# Patient Record
Sex: Female | Born: 1985 | Race: White | Hispanic: No | Marital: Single | Smoking: Never smoker
Health system: Southern US, Community
[De-identification: ages and names within clinical notes are randomized; demographics above are authoritative.]

## PROBLEM LIST (undated history)

## (undated) DIAGNOSIS — M419 Scoliosis, unspecified: Secondary | ICD-10-CM

## (undated) DIAGNOSIS — G809 Cerebral palsy, unspecified: Secondary | ICD-10-CM

## (undated) DIAGNOSIS — H547 Unspecified visual loss: Secondary | ICD-10-CM

## (undated) DIAGNOSIS — G7100 Muscular dystrophy, unspecified: Secondary | ICD-10-CM

---
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
## 2018-11-25 ENCOUNTER — Emergency Department (HOSPITAL_COMMUNITY): Payer: Medicaid Other

## 2018-11-25 ENCOUNTER — Inpatient Hospital Stay (HOSPITAL_COMMUNITY)
Admission: EM | Admit: 2018-11-25 | Discharge: 2018-12-01 | DRG: 178 | Disposition: A | Discharge status: Home / Self Care | Payer: Medicaid Other | Attending: Internal Medicine | Admitting: Internal Medicine

## 2018-11-25 ENCOUNTER — Encounter (HOSPITAL_COMMUNITY): Payer: Self-pay

## 2018-11-25 DIAGNOSIS — K5100 Ulcerative (chronic) pancolitis without complications: Secondary | ICD-10-CM | POA: Diagnosis present

## 2018-11-25 DIAGNOSIS — Z881 Allergy status to other antibiotic agents status: Secondary | ICD-10-CM | POA: Diagnosis not present

## 2018-11-25 DIAGNOSIS — A09 Infectious gastroenteritis and colitis, unspecified: Secondary | ICD-10-CM | POA: Diagnosis present

## 2018-11-25 DIAGNOSIS — R197 Diarrhea, unspecified: Secondary | ICD-10-CM

## 2018-11-25 DIAGNOSIS — J9601 Acute respiratory failure with hypoxia: Secondary | ICD-10-CM | POA: Diagnosis not present

## 2018-11-25 DIAGNOSIS — H547 Unspecified visual loss: Secondary | ICD-10-CM | POA: Diagnosis present

## 2018-11-25 DIAGNOSIS — B370 Candidal stomatitis: Secondary | ICD-10-CM | POA: Diagnosis present

## 2018-11-25 DIAGNOSIS — R112 Nausea with vomiting, unspecified: Secondary | ICD-10-CM | POA: Diagnosis present

## 2018-11-25 DIAGNOSIS — M419 Scoliosis, unspecified: Secondary | ICD-10-CM | POA: Diagnosis present

## 2018-11-25 DIAGNOSIS — G7100 Muscular dystrophy, unspecified: Secondary | ICD-10-CM | POA: Diagnosis present

## 2018-11-25 DIAGNOSIS — E876 Hypokalemia: Secondary | ICD-10-CM | POA: Diagnosis not present

## 2018-11-25 DIAGNOSIS — E860 Dehydration: Secondary | ICD-10-CM | POA: Diagnosis present

## 2018-11-25 DIAGNOSIS — E870 Hyperosmolality and hypernatremia: Secondary | ICD-10-CM | POA: Diagnosis present

## 2018-11-25 DIAGNOSIS — R109 Unspecified abdominal pain: Secondary | ICD-10-CM

## 2018-11-25 DIAGNOSIS — K529 Noninfective gastroenteritis and colitis, unspecified: Secondary | ICD-10-CM | POA: Diagnosis present

## 2018-11-25 DIAGNOSIS — U071 COVID-19: Secondary | ICD-10-CM | POA: Diagnosis not present

## 2018-11-25 DIAGNOSIS — G809 Cerebral palsy, unspecified: Secondary | ICD-10-CM | POA: Diagnosis present

## 2018-11-25 HISTORY — DX: Unspecified visual loss: H54.7

## 2018-11-25 HISTORY — DX: Muscular dystrophy, unspecified: G71.00

## 2018-11-25 HISTORY — DX: Cerebral palsy, unspecified: G80.9

## 2018-11-25 HISTORY — DX: Scoliosis, unspecified: M41.9

## 2018-11-25 LAB — COMPREHENSIVE METABOLIC PANEL
ALT: 44 U/L (ref 0–44)
AST: 71 U/L — ABNORMAL HIGH (ref 15–41)
Albumin: 2.8 g/dL — ABNORMAL LOW (ref 3.5–5.0)
Alkaline Phosphatase: 80 U/L (ref 38–126)
Anion gap: 14 (ref 5–15)
BUN: 7 mg/dL (ref 6–20)
CO2: 23 mmol/L (ref 22–32)
Calcium: 8.1 mg/dL — ABNORMAL LOW (ref 8.9–10.3)
Chloride: 100 mmol/L (ref 98–111)
Creatinine, Ser: 0.68 mg/dL (ref 0.44–1.00)
GFR calc Af Amer: >60 mL/min (ref >60)
GFR calc non Af Amer: >60 mL/min (ref >60)
Glucose, Bld: 76 mg/dL (ref 70–99)
Potassium: 3 mmol/L — ABNORMAL LOW (ref 3.5–5.1)
Sodium: 137 mmol/L (ref 135–145)
Total Bilirubin: 0.7 mg/dL (ref 0.3–1.2)
Total Protein: 7 g/dL (ref 6.5–8.1)

## 2018-11-25 LAB — CBC WITH DIFFERENTIAL/PLATELET
Abs Immature Granulocytes: 0.02 10*3/uL (ref 0.00–0.07)
Basophils Absolute: 0 10*3/uL (ref 0.0–0.1)
Basophils Relative: 0 %
Eosinophils Absolute: 0 10*3/uL (ref 0.0–0.5)
Eosinophils Relative: 0 %
HCT: 42.7 % (ref 36.0–46.0)
Hemoglobin: 13.9 g/dL (ref 12.0–15.0)
Immature Granulocytes: 0 %
Lymphocytes Relative: 18 %
Lymphs Abs: 1.1 10*3/uL (ref 0.7–4.0)
MCH: 27.6 pg (ref 26.0–34.0)
MCHC: 32.6 g/dL (ref 30.0–36.0)
MCV: 84.9 fL (ref 80.0–100.0)
Monocytes Absolute: 0.2 10*3/uL (ref 0.1–1.0)
Monocytes Relative: 3 %
Neutro Abs: 4.8 10*3/uL (ref 1.7–7.7)
Neutrophils Relative %: 79 %
Platelets: 174 10*3/uL (ref 150–400)
RBC: 5.03 MIL/uL (ref 3.87–5.11)
RDW: 14.4 % (ref 11.5–15.5)
WBC: 6.1 10*3/uL (ref 4.0–10.5)
nRBC: 0 % (ref 0.0–0.2)

## 2018-11-25 LAB — LIPASE, BLOOD: Lipase: 23 U/L (ref 11–51)

## 2018-11-25 LAB — I-STAT BETA HCG BLOOD, ED (MC, WL, AP ONLY): I-stat hCG, quantitative: <5 m[IU]/mL (ref <5)

## 2018-11-25 LAB — HCG, QUANTITATIVE, PREGNANCY: hCG, Beta Chain, Quant, S: <1 m[IU]/mL (ref <5)

## 2018-11-25 LAB — LACTIC ACID, PLASMA: Lactic Acid, Venous: 1 mmol/L (ref 0.5–1.9)

## 2018-11-25 MED ORDER — ONDANSETRON HCL 4 MG/2ML IJ SOLN
4.0000 mg | Freq: Once | INTRAMUSCULAR | Status: AC
  Administered 2018-11-25: 4 mg via INTRAVENOUS
  Filled 2018-11-25: qty 2

## 2018-11-25 MED ORDER — HYDROCOD POLST-CPM POLST ER 10-8 MG/5ML PO SUER
5.0000 mL | Freq: Two times a day (BID) | ORAL | Status: DC | PRN
  Filled 2018-11-25: qty 5

## 2018-11-25 MED ORDER — ONDANSETRON HCL 4 MG PO TABS: 4.0000 mg | ORAL_TABLET | Freq: Four times a day (QID) | ORAL | Status: DC | PRN

## 2018-11-25 MED ORDER — ONDANSETRON HCL 4 MG/2ML IJ SOLN
4.0000 mg | Freq: Four times a day (QID) | INTRAMUSCULAR | Status: DC | PRN
  Administered 2018-11-26 – 2018-11-30 (×9): 4 mg via INTRAVENOUS
  Filled 2018-11-25 (×9): qty 2

## 2018-11-25 MED ORDER — GUAIFENESIN-DM 100-10 MG/5ML PO SYRP: 10.0000 mL | ORAL_SOLUTION | ORAL | Status: DC | PRN

## 2018-11-25 MED ORDER — ENOXAPARIN SODIUM 40 MG/0.4ML ~~LOC~~ SOLN
40.0000 mg | Freq: Every day | SUBCUTANEOUS | Status: DC
  Administered 2018-11-26 – 2018-12-01 (×6): 40 mg via SUBCUTANEOUS
  Filled 2018-11-25 (×6): qty 0.4

## 2018-11-25 MED ORDER — SODIUM CHLORIDE 0.9 % IV BOLUS
1000.0000 mL | Freq: Once | INTRAVENOUS | Status: AC
  Administered 2018-11-25: 1000 mL via INTRAVENOUS

## 2018-11-25 MED ORDER — POTASSIUM CHLORIDE 10 MEQ/100ML IV SOLN
10.0000 meq | INTRAVENOUS | Status: AC
  Administered 2018-11-26 (×2): 10 meq via INTRAVENOUS
  Filled 2018-11-25 (×2): qty 100

## 2018-11-25 MED ORDER — ACETAMINOPHEN 325 MG PO TABS
650.0000 mg | ORAL_TABLET | Freq: Once | ORAL | Status: AC
  Administered 2018-11-25: 650 mg via ORAL
  Filled 2018-11-25: qty 2

## 2018-11-25 MED ORDER — POTASSIUM CHLORIDE CRYS ER 20 MEQ PO TBCR
40.0000 meq | EXTENDED_RELEASE_TABLET | Freq: Once | ORAL | Status: AC
  Administered 2018-11-25: 40 meq via ORAL
  Filled 2018-11-25: qty 2

## 2018-11-25 MED ORDER — METRONIDAZOLE IN NACL 5-0.79 MG/ML-% IV SOLN
500.0000 mg | Freq: Once | INTRAVENOUS | Status: DC
  Filled 2018-11-25: qty 100

## 2018-11-25 MED ORDER — IOHEXOL 300 MG/ML  SOLN
100.0000 mL | Freq: Once | INTRAMUSCULAR | Status: AC | PRN
  Administered 2018-11-25: 22:00:00 100 mL via INTRAVENOUS

## 2018-11-25 MED ORDER — SODIUM CHLORIDE (PF) 0.9 % IJ SOLN
INTRAMUSCULAR | Status: AC
  Administered 2018-11-25: 22:00:00
  Filled 2018-11-25: qty 50

## 2018-11-25 MED ORDER — CIPROFLOXACIN IN D5W 400 MG/200ML IV SOLN
400.0000 mg | Freq: Once | INTRAVENOUS | Status: DC
  Filled 2018-11-25: qty 200

## 2018-11-25 MED ORDER — SODIUM CHLORIDE 0.9 % IV SOLN
INTRAVENOUS | Status: DC
  Administered 2018-11-26 (×3): via INTRAVENOUS

## 2018-11-25 NOTE — ED Triage Notes (Signed)
Pt has cerebral palsy, c/o vomiting and diarrhea. Pt PCP told mother to bring pt here for hydration.

## 2018-11-25 NOTE — ED Notes (Signed)
Pure wick has been placed. Pt has been educated on pure wick. Suction set to 45mmHg 

## 2018-11-25 NOTE — ED Notes (Signed)
Bed: SH68 Expected date:  Expected time:  Means of arrival:  Comments: covid positive-dehydration

## 2018-11-25 NOTE — ED Notes (Signed)
Pt attempted to use BSC with hat to give urine specimen but had episode of diarrhea that contaminated urine specimen.

## 2018-11-25 NOTE — ED Notes (Signed)
Pt transported to CT ?

## 2018-11-25 NOTE — ED Notes (Signed)
Unsuccessful IV attempt x2.  

## 2018-11-25 NOTE — H&P (Signed)
History and Physical    Tina Oliver:[id number] DOB: 1985-11-23 DOA: 11/25/2018  PCP: Abigail Miyamoto, MD  Patient coming from: Home  I have personally briefly reviewed patient's old medical records in [facility]  Chief Complaint: N/V/D  HPI: Tina Oliver is a 33 y.o. female with medical history significant of CP, MD, total blindness.  Patient is cared for at home by parents.  Father tested positive for COVID x11 days ago and has been ill at home.  Patient has also tested positive for COVID-19 as an outpatient per mother.  Patient has had fever, but little to no respiratory symptoms.  Patient has developed intractable N/V/D at home, zofran, phenergan, and tylenol have been tried without success.  Patient not able to tolerate POs.  Told by PCP to bring to hospital for IVF.   ED Course: WBC 6.x, CT abd/pelvis demonstrates colitis and BLL diffuse infiltrates.   Review of Systems: Unable to perform due to CP  Past Medical History:  Diagnosis Date   Cerebral palsy (HCC)    Muscular dystrophy (HCC)    Scoliosis    Total blindness    both eyes    History reviewed. No pertinent surgical history.   reports that she has never smoked. She has never used smokeless tobacco. She reports that she does not drink alcohol or use drugs.  Allergies  Allergen Reactions   Ceclor [Cefaclor] Nausea And Vomiting   Amoxicillin Rash    Mild rash as a young child. Unknown cephalosporin history/not tried    History reviewed. No pertinent family history. Father also has confirmed COVID-19  Prior to Admission medications   Medication Sig Start Date End Date Taking? Authorizing Provider  acetaminophen (TYLENOL) 500 MG tablet Take 1,000 mg by mouth every 6 (six) hours as needed for moderate pain.   Yes [provider]  dicyclomine (BENTYL) 10 MG/5ML syrup Take 10 mg by mouth 3 (three) times daily before meals.  11/23/18  Yes [provider]    norelgestromin-ethinyl estradiol [facility]) 150-35 MCG/24HR transdermal patch Place 1 patch onto the skin See admin instructions. Change every 10 days   Yes [provider]    Physical Exam: Vitals:   11/25/18 1917 11/25/18 2000 11/25/18 2014 11/25/18 2222  BP: (!) 101/45 102/63  91/66  Pulse: 82 82  91  Resp: (!) Temp:   (!) 101.6 F (38.7 C)   TempSrc:   Oral   SpO2: 94% 95%  93%  Weight:      Height:        Constitutional: NAD, calm, comfortable Eyes: PERRL, lids and conjunctivae normal ENMT: Mucous membranes are moist. Posterior pharynx clear of any exudate or lesions.Normal dentition.  Neck: normal, supple, no masses, no thyromegaly Respiratory: clear to auscultation bilaterally, no wheezing, no crackles. Normal respiratory effort. No accessory muscle use.  Cardiovascular: Regular rate and rhythm, no murmurs / rubs / gallops. No extremity edema. 2+ pedal pulses. No carotid bruits.  Abdomen: no tenderness, no masses palpated. No hepatosplenomegaly. Bowel sounds positive.  Musculoskeletal: no clubbing / cyanosis. No joint deformity upper and lower extremities. Good ROM, no contractures. Normal muscle tone.  Skin: no rashes, lesions, ulcers. No induration Neurologic: CN 2-12 grossly intact. Sensation intact, DTR normal. Strength 5/5 in all 4.  Psychiatric: Normal judgment and insight. Alert and oriented x 3. Normal mood.    Labs on Admission: I have personally reviewed following labs and imaging studies  CBC: Recent  Labs  Lab 11/25/18 1753  WBC 6.1  NEUTROABS 4.8  HGB 13.9  HCT 42.7  MCV 84.9  PLT 174   Basic Metabolic Panel: Recent Labs  Lab 11/25/18 1753  NA 137  K 3.0*  CL 100  CO2 23  GLUCOSE 76  BUN 7  CREATININE 0.68  CALCIUM 8.1*   GFR: Estimated Creatinine Clearance: 96.5 mL/min (by C-G formula based on SCr of 0.68 mg/dL). Liver Function Tests: Recent Labs  Lab 11/25/18 1753  AST 71*  ALT 44  ALKPHOS 80  BILITOT 0.7  PROT  7.0  ALBUMIN 2.8*   Recent Labs  Lab 11/25/18 1753  LIPASE 23   No results for input(s): AMMONIA in the last 168 hours. Coagulation Profile: No results for input(s): INR, PROTIME in the last 168 hours. Cardiac Enzymes: No results for input(s): CKTOTAL, CKMB, CKMBINDEX, TROPONINI in the last 168 hours. BNP (last 3 results) No results for input(s): PROBNP in the last 8760 hours. HbA1C: No results for input(s): HGBA1C in the last 72 hours. CBG: No results for input(s): GLUCAP in the last 168 hours. Lipid Profile: No results for input(s): CHOL, HDL, LDLCALC, TRIG, CHOLHDL, LDLDIRECT in the last 72 hours. Thyroid Function Tests: No results for input(s): TSH, T4TOTAL, FREET4, T3FREE, THYROIDAB in the last 72 hours. Anemia Panel: No results for input(s): VITAMINB12, FOLATE, FERRITIN, TIBC, IRON, RETICCTPCT in the last 72 hours. Urine analysis: No results found for: COLORURINE, APPEARANCEUR, LABSPEC, PHURINE, GLUCOSEU, HGBUR, BILIRUBINUR, KETONESUR, PROTEINUR, UROBILINOGEN, NITRITE, LEUKOCYTESUR  Radiological Exams on Admission: Ct Abdomen Pelvis W Contrast  Result Date: 11/25/2018 CLINICAL DATA:  Abdominal pain EXAM: CT ABDOMEN AND PELVIS WITH CONTRAST TECHNIQUE: Multidetector CT imaging of the abdomen and pelvis was performed using the standard protocol following bolus administration of intravenous contrast. CONTRAST:  100mL OMNIPAQUE IOHEXOL 300 MG/ML  SOLN COMPARISON:  None. FINDINGS: Lower chest: Extensive, irregular ground-glass and heterogeneous opacity of the included bilateral lung bases. Hepatobiliary: No focal liver abnormality is seen. No gallstones, gallbladder wall thickening, or biliary dilatation. Pancreas: Unremarkable. No pancreatic ductal dilatation or surrounding inflammatory changes. Spleen: Normal in size without focal abnormality. Adrenals/Urinary Tract: Adrenal glands are unremarkable. Kidneys are normal, without renal calculi, focal lesion, or hydronephrosis. Bladder  is unremarkable. Stomach/Bowel: Stomach is within normal limits. Appendix appears normal. Mild hyperenhancement of the distal descending colon, sigmoid, and rectum, with a somewhat featureless appearance (series 2, image 62, series 7, image 108). Vascular/Lymphatic: No significant vascular findings are present. No enlarged abdominal or pelvic lymph nodes. Reproductive: No mass or other abnormality. Other: No abdominal wall hernia or abnormality. No abdominopelvic ascites. Musculoskeletal: No acute or significant osseous findings. IMPRESSION: 1. Mild hyperenhancement of the distal descending colon, sigmoid, and rectum, with a somewhat featureless appearance (series 2, image 62, series 7, image 108). This appearance is consistent with nonspecific infectious or inflammatory proctocolitis, and particularly inflammatory bowel disease such as ulcerative colitis is a consideration with this appearance. 2. Extensive, irregular ground-glass and heterogeneous opacity of the included bilateral lung bases, consistent with infection or aspiration. Electronically Signed   By: Lauralyn PrimesAlex  Bibbey M.D.   On: 11/25/2018 22:01   Dg Chest Port 1 View  Result Date: 11/25/2018 CLINICAL DATA:  Vomiting, diarrhea, history of cerebral palsy EXAM: PORTABLE CHEST 1 VIEW COMPARISON:  None. FINDINGS: Cardiomegaly. Left basilar heterogeneous opacity and probable small layering pleural effusion. IMPRESSION: Cardiomegaly. Left basilar heterogeneous opacity and probable small layering pleural effusion, concerning for infection or aspiration. Electronically Signed   By: Trinna PostAlex  Jayme Cloud M.D.   On: 11/25/2018 19:39    EKG: Independently reviewed.  Assessment/Plan Principal Problem:   COVID-19 virus infection Active Problems:   Nausea vomiting and diarrhea   Colitis presumed infectious    1. COVID-19 - confirmed on outpatient testing per mother's report, CT findings also c/w COVID, dont really have a reason to re-test at this point. 1. COVID  pathway 2. IVF 3. Replace K 4. CRP, D.Dimer, procalcitonin, pending 5. Repeat CBC/CMP in AM 2. N/V/D, colitis - 1. Very likely secondary to COVID 2. No WBC 3. Checking pro calcitonin 4. IVF: NS at 125 cc/hr 5. zofran PRN  DVT prophylaxis: Lovenox Code Status: Full Family Communication: Mother at bedside, is apparently going to be staying with patient.  Very high risk for mother to come down with COVID as she has been caring for patient, and husband at home, who both have COVID, and mother isnt even wearing a mask (did discuss this with mother and have STRONGLY urged her to use all recommended PPE at all times at a minimum). Disposition Plan: Home after admit Consults called: None Admission status: Admit to inpatient  Severity of Illness: The appropriate patient status for this patient is INPATIENT. Inpatient status is judged to be reasonable and necessary in order to provide the required intensity of service to ensure the patient's safety. The patient's presenting symptoms, physical exam findings, and initial radiographic and laboratory data in the context of their chronic comorbidities is felt to place them at high risk for further clinical deterioration. Furthermore, it is not anticipated that the patient will be medically stable for discharge from the hospital within 2 midnights of admission. The following factors support the patient status of inpatient.   " The patient's presenting symptoms include N/V/D, known to be COVID-19 positive. " The worrisome physical exam findings include Hypoxia with new O2 requirement. " The initial radiographic and laboratory data are worrisome because of B infiltrates on CT scan, colitis on CT. " The chronic co-morbidities include blindness, MD, CP.   * I certify that at the point of admission it is my clinical judgment that the patient will require inpatient hospital care spanning beyond 2 midnights from the point of admission due to high intensity of  service, high risk for further deterioration and high frequency of surveillance required.*    Kaemon Barnett M. DO [facility]  How to contact the [facility] Attending or Consulting provider 7A - 7P or covering provider during after hours 7P -7A, for this patient?  1. Check the care team in [facility] and look for a) attending/consulting [facility] provider listed and b) the [facility] team listed 2. Log into [email or web address]  Amion Physician Scheduling and messaging for groups and whole hospitals  On call and physician scheduling software for group practices, residents, hospitalists and other medical providers for call, clinic, rotation and shift schedules. On[employer] is a hospital-wide system for scheduling doctors and paging doctors on call. EasyPlot is for scientific plotting and data analysis.  [email or web address]  and use [facility] universal password to access. If you do not have the password, please contact the hospital operator.  3. Locate the [facility] provider you are looking for under [facility] and page to a number that you can be directly reached. 4. If you still have difficulty reaching the provider, please page the [facility] (Director on Call) for the Hospitalists listed on amion for assistance.  11/25/2018, 11:41 PM

## 2018-11-25 NOTE — ED Notes (Addendum)
Pt's mom at bedside without proper PPE. Writer explained to mom the proper PPE that should be worn and mom verified she understood what needed to be worn. Pt's mom has on gown and mask at this time.

## 2018-11-25 NOTE — ED Provider Notes (Signed)
[facility]-EMERGENCY DEPT Provider Note   CSN: [id number] Arrival date & time: 11/25/18  1514    History   Chief Complaint Chief Complaint  Patient presents with  . Dehydration    COVID-19 +    HPI Tina Oliver is a 33 y.o. female.     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 Patient and her father tested positive for COVID 11 days ago, patient has had fever, little/no respiratory symptoms. Patient has tried Zofran, Phenergan PR, Tylenol PR at home with little relief/unable to tolerate due to frequent diarrhea (loose, non bloody stools). No prior abdominal surgeries. No concerns for cough, respiratory complications.      Past Medical History:  Diagnosis Date  . Cerebral palsy (HCC)   . Muscular dystrophy (HCC)   . Scoliosis   . Total blindness    both eyes    There are no active problems to display for this patient.   OB History   No obstetric history on file.      Home Medications    Prior to Admission medications   Not on File    Family History No family history on file.  Social History Social History   Tobacco Use  . Smoking status: Never Smoker  . Smokeless tobacco: Never Used  Substance Use Topics  . Alcohol use: Never    Frequency: Never  . Drug use: Never     Allergies   Ceclor [cefaclor] and Amoxicillin   Review of Systems Review of Systems  Unable to perform ROS: Other (chronic health conditions )  Constitutional: Positive for fever.  Respiratory: Negative for cough and shortness of breath.   Gastrointestinal: Positive for abdominal pain, diarrhea and vomiting. Negative for abdominal distention and blood in stool.  Skin: Negative for rash and wound.     Physical Exam Updated Vital Signs BP 91/66   Pulse 91   Temp (!) 101.6 F (38.7 C) (Oral)   Resp 19   Ht  (1.626 m)   Wt 68 kg   SpO2 93%   BMI  25.75 kg/m   Physical Exam Vitals signs and nursing note reviewed.  Constitutional:      Comments: thin  HENT:     Head: Normocephalic and atraumatic.     Mouth/Throat:     Mouth: Mucous membranes are dry.  Cardiovascular:     Rate and Rhythm: Normal rate and regular rhythm.     Pulses: Normal pulses.  Pulmonary:     Effort: Pulmonary effort is normal.  Abdominal:     Tenderness: There is abdominal tenderness in the left lower quadrant. There is guarding.  Musculoskeletal:        General: No tenderness, deformity or signs of injury.  Skin:    General: Skin is warm and dry.     Findings: No erythema or rash.  Neurological:     General: No focal deficit present.     Mental Status: She is alert. Mental status is at baseline.      ED Treatments / Results  Labs (all labs ordered are listed, but only abnormal results are displayed) Labs Reviewed  COMPREHENSIVE METABOLIC PANEL - Abnormal; Notable for the following components:      Result Value   Potassium 3.0 (*)    Calcium 8.1 (*)    Albumin 2.8 (*)    AST 71 (*)    All other  components within normal limits  CULTURE, BLOOD (ROUTINE X 2)  CULTURE, BLOOD (ROUTINE X 2)  CBC WITH DIFFERENTIAL/PLATELET  LIPASE, BLOOD  LACTIC ACID, PLASMA  HCG, QUANTITATIVE, PREGNANCY  URINALYSIS, ROUTINE W REFLEX MICROSCOPIC  LACTIC ACID, PLASMA  I-STAT BETA HCG BLOOD, ED ([facility], [facility], [facility] ONLY)    EKG None  Radiology Ct Abdomen Pelvis W Contrast  Result Date: 11/25/2018 CLINICAL DATA:  Abdominal pain EXAM: CT ABDOMEN AND PELVIS WITH CONTRAST TECHNIQUE: Multidetector CT imaging of the abdomen and pelvis was performed using the standard protocol following bolus administration of intravenous contrast. CONTRAST:  OMNIPAQUE IOHEXOL 300 MG/ML  SOLN COMPARISON:  None. FINDINGS: Lower chest: Extensive, irregular ground-glass and heterogeneous opacity of the included bilateral lung bases. Hepatobiliary: No focal liver abnormality is seen. No  gallstones, gallbladder wall thickening, or biliary dilatation. Pancreas: Unremarkable. No pancreatic ductal dilatation or surrounding inflammatory changes. Spleen: Normal in size without focal abnormality. Adrenals/Urinary Tract: Adrenal glands are unremarkable. Kidneys are normal, without renal calculi, focal lesion, or hydronephrosis. Bladder is unremarkable. Stomach/Bowel: Stomach is within normal limits. Appendix appears normal. Mild hyperenhancement of the distal descending colon, sigmoid, and rectum, with a somewhat featureless appearance (series 2, image 62, series 7, image 108). Vascular/Lymphatic: No significant vascular findings are present. No enlarged abdominal or pelvic lymph nodes. Reproductive: No mass or other abnormality. Other: No abdominal wall hernia or abnormality. No abdominopelvic ascites. Musculoskeletal: No acute or significant osseous findings. IMPRESSION: 1. Mild hyperenhancement of the distal descending colon, sigmoid, and rectum, with a somewhat featureless appearance (series 2, image 62, series 7, image 108). This appearance is consistent with nonspecific infectious or inflammatory proctocolitis, and particularly inflammatory bowel disease such as ulcerative colitis is a consideration with this appearance. 2. Extensive, irregular ground-glass and heterogeneous opacity of the included bilateral lung bases, consistent with infection or aspiration. Electronically Signed   By: Lauralyn Primes M.D.   On: 11/25/2018 22:01   Dg Chest Port 1 View  Result Date: 11/25/2018 CLINICAL DATA:  Vomiting, diarrhea, history of cerebral palsy EXAM: PORTABLE CHEST 1 VIEW COMPARISON:  None. FINDINGS: Cardiomegaly. Left basilar heterogeneous opacity and probable small layering pleural effusion. IMPRESSION: Cardiomegaly. Left basilar heterogeneous opacity and probable small layering pleural effusion, concerning for infection or aspiration. Electronically Signed   By: Lauralyn Primes M.D.   On: 11/25/2018 19:39     Procedures Procedures (including critical care time)  Medications Ordered in ED Medications  ciprofloxacin (CIPRO) IVPB 400 mg (has no administration in time range)  metroNIDAZOLE (FLAGYL) IVPB 500 mg (has no administration in time range)  ondansetron (ZOFRAN) injection 4 mg (4 mg Intravenous Given 11/25/18 1800)  sodium chloride 0.9 % bolus 1,000 mL (0 mLs Intravenous Stopped 11/25/18 1915)  sodium chloride 0.9 % bolus 1,000 mL (1,000 mLs Intravenous New Bag/Given 11/25/18 2215)  sodium chloride (PF) 0.9 % injection (  Given 11/25/18 2223)  iohexol (OMNIPAQUE) 300 MG/ML solution 100 mL (100 mLs Intravenous Contrast Given 11/25/18 2143)  acetaminophen (TYLENOL) tablet 650 mg (650 mg Oral Given 11/25/18 2214)  potassium chloride SA (K-DUR) CR tablet 40 mEq (40 mEq Oral Given 11/25/18 2214)     Initial Impression / Assessment and Plan / ED Course  I have reviewed the triage vital signs and the nursing notes.  Pertinent labs & imaging results that were available during my care of the patient were reviewed by me and considered in my medical decision making (see chart for details).  Clinical Course as of Nov 24 2228  Fri Nov 25, 2018  [id number] 33 year old female with history of muscular dystrophy cerebral palsy, recently diagnosed with COVID 11 days ago.  Mom reports now with vomiting and diarrhea, unable to tolerate p.o.'s at home, no relief with Phenergan as needed Zofran ODT.  On exam patient has dry mucous membranes and left side abdominal tenderness.  Review of lab work, patient's hCG is negative, lipase within normal limits, CMP with mild hypokalemia with potassium of 3.0 which was repleted orally.  Patient's lactic acid is within normal limits, CBC within normal limits.  CT abdomen and pelvis with contrast shows proctocolitis versus ulcerative colitis.  Recommend admission to the hospital for IV antibiotics and monitoring.  Patient's mother would prefer to p.o. challenge in the ER and defers  admission decision at this time.   [LM]    Clinical Course User Index [LM] Jeannie FendMurphy, Marnita Poirier A, PA-C      Final Clinical Impressions(s) / ED Diagnoses   Final diagnoses:  Proctocolitis  COVID-19  Hypokalemia    ED Discharge Orders    None       Alden HippMurphy, Promyse Ardito A, PA-C 11/25/18 2230    Wynetta FinesMessick, Peter C, MD 11/28/18 [phone]

## 2018-11-25 NOTE — ED Notes (Signed)
Carollee Herter, RN aware of previously charted vital signs.

## 2018-11-26 ENCOUNTER — Other Ambulatory Visit: Payer: Self-pay

## 2018-11-26 DIAGNOSIS — U071 COVID-19: Principal | ICD-10-CM

## 2018-11-26 LAB — CBC WITH DIFFERENTIAL/PLATELET
Abs Immature Granulocytes: 0.05 10*3/uL (ref 0.00–0.07)
Basophils Absolute: 0 10*3/uL (ref 0.0–0.1)
Basophils Relative: 0 %
Eosinophils Absolute: 0 10*3/uL (ref 0.0–0.5)
Eosinophils Relative: 0 %
HCT: 45 % (ref 36.0–46.0)
Hemoglobin: 13.9 g/dL (ref 12.0–15.0)
Immature Granulocytes: 1 %
Lymphocytes Relative: 23 %
Lymphs Abs: 1.7 10*3/uL (ref 0.7–4.0)
MCH: 26.7 pg (ref 26.0–34.0)
MCHC: 30.9 g/dL (ref 30.0–36.0)
MCV: 86.4 fL (ref 80.0–100.0)
Monocytes Absolute: 0.2 10*3/uL (ref 0.1–1.0)
Monocytes Relative: 3 %
Neutro Abs: 5.2 10*3/uL (ref 1.7–7.7)
Neutrophils Relative %: 73 %
Platelets: 180 10*3/uL (ref 150–400)
RBC: 5.21 MIL/uL — ABNORMAL HIGH (ref 3.87–5.11)
RDW: 14.7 % (ref 11.5–15.5)
WBC: 7.1 10*3/uL (ref 4.0–10.5)
nRBC: 0 % (ref 0.0–0.2)

## 2018-11-26 LAB — COMPREHENSIVE METABOLIC PANEL
ALT: 39 U/L (ref 0–44)
AST: 60 U/L — ABNORMAL HIGH (ref 15–41)
Albumin: 2.8 g/dL — ABNORMAL LOW (ref 3.5–5.0)
Alkaline Phosphatase: 70 U/L (ref 38–126)
Anion gap: 10 (ref 5–15)
BUN: <5 mg/dL — ABNORMAL LOW (ref 6–20)
CO2: 20 mmol/L — ABNORMAL LOW (ref 22–32)
Calcium: 7.9 mg/dL — ABNORMAL LOW (ref 8.9–10.3)
Chloride: 111 mmol/L (ref 98–111)
Creatinine, Ser: 0.6 mg/dL (ref 0.44–1.00)
GFR calc Af Amer: >60 mL/min (ref >60)
GFR calc non Af Amer: >60 mL/min (ref >60)
Glucose, Bld: 97 mg/dL (ref 70–99)
Potassium: 4 mmol/L (ref 3.5–5.1)
Sodium: 141 mmol/L (ref 135–145)
Total Bilirubin: 0.3 mg/dL (ref 0.3–1.2)
Total Protein: 6.9 g/dL (ref 6.5–8.1)

## 2018-11-26 LAB — D-DIMER, QUANTITATIVE
D-Dimer, Quant: 1.06 ug/mL-FEU — ABNORMAL HIGH (ref 0.00–0.50)
D-Dimer, Quant: 1.45 ug/mL-FEU — ABNORMAL HIGH (ref 0.00–0.50)

## 2018-11-26 LAB — URINALYSIS, ROUTINE W REFLEX MICROSCOPIC
Bilirubin Urine: NEGATIVE
Glucose, UA: NEGATIVE mg/dL
Hgb urine dipstick: NEGATIVE
Ketones, ur: 20 mg/dL — AB
Leukocytes,Ua: NEGATIVE
Nitrite: NEGATIVE
Protein, ur: NEGATIVE mg/dL
Specific Gravity, Urine: 1.036 — ABNORMAL HIGH (ref 1.005–1.030)
pH: 6 (ref 5.0–8.0)

## 2018-11-26 LAB — ABO/RH: ABO/RH(D): O POS

## 2018-11-26 LAB — SARS CORONAVIRUS 2 BY RT PCR (HOSPITAL ORDER, PERFORMED IN ~~LOC~~ HOSPITAL LAB): SARS Coronavirus 2: POSITIVE — AB

## 2018-11-26 LAB — HIV ANTIBODY (ROUTINE TESTING W REFLEX): HIV Screen 4th Generation wRfx: NONREACTIVE

## 2018-11-26 LAB — PROCALCITONIN: Procalcitonin: 94.44 ng/mL

## 2018-11-26 LAB — C-REACTIVE PROTEIN
CRP: 19.9 mg/dL — ABNORMAL HIGH (ref <1.0)
CRP: 25.6 mg/dL — ABNORMAL HIGH (ref <1.0)

## 2018-11-26 LAB — LACTIC ACID, PLASMA: Lactic Acid, Venous: 1.9 mmol/L (ref 0.5–1.9)

## 2018-11-26 MED ORDER — ACETAMINOPHEN 325 MG PO TABS
650.0000 mg | ORAL_TABLET | Freq: Four times a day (QID) | ORAL | Status: DC | PRN
  Administered 2018-11-26: 650 mg via ORAL
  Filled 2018-11-26 (×2): qty 2

## 2018-11-26 MED ORDER — SODIUM CHLORIDE 0.9 % IV SOLN
1.0000 g | Freq: Three times a day (TID) | INTRAVENOUS | Status: DC
  Administered 2018-11-26 – 2018-11-28 (×6): 1 g via INTRAVENOUS
  Filled 2018-11-26 (×7): qty 1

## 2018-11-26 MED ORDER — SODIUM CHLORIDE 0.9 % IV SOLN
1.0000 g | Freq: Three times a day (TID) | INTRAVENOUS | Status: DC
  Administered 2018-11-26: 1 g via INTRAVENOUS
  Filled 2018-11-26 (×3): qty 1

## 2018-11-26 MED ORDER — ORAL CARE MOUTH RINSE
15.0000 mL | Freq: Two times a day (BID) | OROMUCOSAL | Status: DC
  Administered 2018-11-27 – 2018-12-01 (×8): 15 mL via OROMUCOSAL

## 2018-11-26 MED ORDER — MORPHINE SULFATE (PF) 2 MG/ML IV SOLN
2.0000 mg | INTRAVENOUS | Status: DC | PRN
  Administered 2018-11-26 – 2018-11-29 (×9): 2 mg via INTRAVENOUS
  Filled 2018-11-26 (×10): qty 1

## 2018-11-26 MED ORDER — ACETAMINOPHEN 650 MG RE SUPP
650.0000 mg | RECTAL | Status: DC | PRN
  Administered 2018-11-26 – 2018-11-27 (×2): 650 mg via RECTAL
  Filled 2018-11-26 (×2): qty 1

## 2018-11-26 MED ORDER — SODIUM CHLORIDE 0.9 % IV SOLN
INTRAVENOUS | Status: DC
  Administered 2018-11-26 – 2018-11-27 (×2): via INTRAVENOUS

## 2018-11-26 NOTE — Progress Notes (Signed)
CRITICAL VALUE ALERT  Critical Value:  Positive covid Date & Time Notied: 11/26/2018 0346   Provider Notified: [email or web address]   Orders Received/Actions taken: awaiting new orders will continue to monitor patient closely

## 2018-11-26 NOTE — Progress Notes (Signed)
Report called to [place] at [facility].  Notified by primary MD that transfer needed to be held until GI consulted regarding diarrhea.  [facility] notified of same.

## 2018-11-26 NOTE — Progress Notes (Signed)
Patient ID: Tina Oliver, female   DOB: 02-12-1986, 33 y.o.   MRN: [id number]  Called by Dr. Jacqulyn Bath regarding patient's diarrhea and colitis noted on CT. Chart reviewed and discussed with hospitalist. Patient had acute onset of nausea, vomiting, and diarrhea in the setting of having COVID-19. Patient is unable to give a history due to her cerebral palsy but reportedly the diarrhea has been present for a few days without report of rectal bleeding. CT scan shows mild inflammation of the distal colon that is nonspecific. Patient not examined due to positive COVID-19 status and lack of need in my opinion to have any endoscopic procedures done. Discussed my assessment/recs with Dr. Jacqulyn Bath and in my opinion I think her colitis is due to her COVID-19 and NOT due to inflammatory bowel disease. Acute presentation of diarrhea is infection until proven otherwise. Patient pending transfer to [facility] for her COVID. Check GI pathogen panel. Would manage COVID-19 per primary team and if diarrhea persists after resolution of the COVID-19 disease then can evaluate for other etiologies as an outpatient. I do NOT think she needs any endoscopic procedures for this process at this time, since I think it is infectious in etiology related to most likely her COVID-19 but other infectious colitis processes also possible hence the need to check a GI pathogen panel. I do not think Mesalamine or Sulfasalazine enemas are necessary since I do not think she has ulcerative colitis.

## 2018-11-26 NOTE — Progress Notes (Signed)
Pharmacy Antibiotic Note  Tina Oliver is a 33 y.o. female admitted on 11/25/2018 with COVID and now CT abdomen concerning for intra-abdominal infection.  Pharmacy has been consulted for Meropenem dosing given allergy to amoxicillin. WBC wnl, Tm 102.10F. SCr wnl   Plan: -Start meropenem 1 gm IV Q 8 hours  -Monitor CBC, renal fx, cultures and clinical progress  Height: 5\' 3"  (160 cm) Weight: 150 lb 2.1 oz (68.1 kg) IBW/kg (Calculated) : 52.4  Temp (24hrs), Avg:100.7 F (38.2 C), Min:97.9 F (36.6 C), Max:102.9 F (39.4 C)  Recent Labs  Lab 11/25/18 1753 11/26/18 0624  WBC 6.1 7.1  CREATININE 0.68 0.60  LATICACIDVEN 1.0 1.9    Estimated Creatinine Clearance: 94.4 mL/min (by C-G formula based on SCr of 0.6 mg/dL).    Allergies  Allergen Reactions  . Ceclor [Cefaclor] Nausea And Vomiting  . Amoxicillin Rash    Mild rash as a young child. Unknown cephalosporin history/not tried    Antimicrobials this admission: Meropenem 5/16 >>    Dose adjustments this admission:   Microbiology results: 5/16 BCx:    Thank you for allowing pharmacy to be a part of this patient's care.  Vinnie Level, PharmD., BCPS Clinical Pharmacist Clinical phone for [phone] until 7pm: [phone]

## 2018-11-26 NOTE — ED Notes (Signed)
Pt attempted another urine specimen vita the BSC but contaminated it with stool

## 2018-11-26 NOTE — Progress Notes (Signed)
Upon assessment of patient upon arrival, patient oriented to self and dob, but unable to state age, when asked if she knew where she was she stated no, I oriented her to being in the hospital; she stated she was here because her "tummy hurts" when asked why she was brought to the hospital. She speaks in a childlike manor, when answering and seems to have difficulty finding words to express her self at times. Pt is blind and very anxious, when educating on call light and what to do to get someone to come into the room when she needs something, she became tearful and was shaking her legs and asked who was going to be staying with her, I again attempted to orient her to our unit and call light system. When trying to leave the room, she was crying and asking not to be left alone because patient stated "I cant see and Im never alone home" I again tried to reassure her and attempted to use comparisons to home [id number] in her own room at home and her parents in another room and she told me she sleeps in her parents room. Upon further investigation patient is never left unattended and relies on her parents for all ADLS. Because of the visitor limitations her mother isn't at bedside, this patient may benefit for a sitter or family at bedside at some point if sitter isn't a possibility. Notified MD Dr Thedore Mins, on current assessments and needs, because of the special circumstances patients has needs that need to be accommodated if he could assess her and advise if a sitter could be ordered for her in these circumstances, 1:1 initiated by floor staff until further assessments can be made by MD. Charge nurse and [facility] also notified of patients needs and are also working to facilitate care to accomodate patients needs.

## 2018-11-26 NOTE — Progress Notes (Addendum)
PROGRESS NOTE    Tina Oliver  ZOX:[id number] DOB: Jan 20, 1986 DOA: 11/25/2018 PCP: Abigail MiyamotoPerry, Lawrence Edward, MD   Brief Narrative:  Tina Oliver is a 33 y.o. female with medical history significant of CP, MD, total blindness.  Patient is cared for at home by parents.  Father tested positive for COVID x11 days ago and has been ill at home. Patient has also tested positive for COVID-19 as an outpatient per mother. Patient has had fever, but little to no respiratory symptoms.  Patient has developed intractable N/V/D at home, zofran, phenergan, and tylenol have been tried without success.  Patient not able to tolerate POs.  Told by PCP to bring to hospital for IVF. She was admitted under hospitalist service for possible colitis.  Procalcitonin elevated.  She was tested positive again here with COVID-19.  ED Course: WBC 6.x, CT abd/pelvis demonstrates colitis and BLL diffuse infiltrates.  Consultants:   None  Procedures:   None  Antimicrobials:   Zosyn   Subjective: Patient seen and examined.  Mom in the room at the bedside with no mask on.  No mask on the patient either.  Patient stated that she feels horrible due to having abdominal pain.  She denied any shortness of breath.  She has been having persistent fever.  Objective: Vitals:   11/26/18 0540 11/26/18 0608 11/26/18 0759 11/26/18 1000  BP:  117/71    Pulse:  78    Resp:  (!) 24    Temp: (!) 101.1 F (38.4 C)  (!) 102.9 F (39.4 C) (!) 102 F (38.9 C)  TempSrc: Oral  Oral Oral  SpO2:  100% 93%   Weight:      Height:        Intake/Output Summary (Last 24 hours) at 11/26/2018 1015 Last data filed at 11/26/2018 0430 Gross per 24 hour  Intake 2200 ml  Output --  Net 2200 ml   Filed Weights   11/25/18 1611 11/26/18 0230  Weight: 68 kg 68.1 kg    Examination:  General exam: Appears calm and comfortable with poor dentition Respiratory system: Scattered rhonchi bilaterally.  Respiratory effort  normal. Cardiovascular system: S1 & S2 heard, RRR. No JVD, murmurs, rubs, gallops or clicks. No pedal edema. Gastrointestinal system: Abdomen is nondistended, soft and slightly tender at left lower quadrant, no organomegaly or masses felt. Normal bowel sounds heard. Central nervous system: Alert and oriented. No focal neurological deficits. Extremities: Symmetric 5 x 5 power. Skin: No rashes, lesions or ulcers Psychiatry: Judgement and insight appear normal. Mood & affect appropriate.    Data Reviewed: I have personally reviewed following labs and imaging studies  CBC: Recent Labs  Lab 11/25/18 1753 11/26/18 0624  WBC 6.1 7.1  NEUTROABS 4.8 5.2  HGB 13.9 13.9  HCT 42.7 45.0  MCV 84.9 86.4  PLT 174 180   Basic Metabolic Panel: Recent Labs  Lab 11/25/18 1753 11/26/18 0624  NA 137 141  K 3.0* 4.0  CL 100 111  CO2 23 20*  GLUCOSE 76 97  BUN 7 <5*  CREATININE 0.68 0.60  CALCIUM 8.1* 7.9*   GFR: Estimated Creatinine Clearance: 94.4 mL/min (by C-G formula based on SCr of 0.6 mg/dL). Liver Function Tests: Recent Labs  Lab 11/25/18 1753 11/26/18 0624  AST 71* 60*  ALT 44 39  ALKPHOS 80 70  BILITOT 0.7 0.3  PROT 7.0 6.9  ALBUMIN 2.8* 2.8*   Recent Labs  Lab 11/25/18 1753  LIPASE 23   No results for  input(s): AMMONIA in the last 168 hours. Coagulation Profile: No results for input(s): INR, PROTIME in the last 168 hours. Cardiac Enzymes: No results for input(s): CKTOTAL, CKMB, CKMBINDEX, TROPONINI in the last 168 hours. BNP (last 3 results) No results for input(s): PROBNP in the last 8760 hours. HbA1C: No results for input(s): HGBA1C in the last 72 hours. CBG: No results for input(s): GLUCAP in the last 168 hours. Lipid Profile: No results for input(s): CHOL, HDL, LDLCALC, TRIG, CHOLHDL, LDLDIRECT in the last 72 hours. Thyroid Function Tests: No results for input(s): TSH, T4TOTAL, FREET4, T3FREE, THYROIDAB in the last 72 hours. Anemia Panel: No results for  input(s): VITAMINB12, FOLATE, FERRITIN, TIBC, IRON, RETICCTPCT in the last 72 hours. Sepsis Labs: Recent Labs  Lab 11/25/18 1753 11/26/18 0054 11/26/18 0624  PROCALCITON  --  94.44  --   LATICACIDVEN 1.0  --  1.9    Recent Results (from the past 240 hour(s))  Blood culture (routine x 2)     Status: None (Preliminary result)   Collection Time: 11/25/18  5:58 PM  Result Value Ref Range Status   Specimen Description   Final    BLOOD RIGHT ARM Performed at [facility], [address], [place], [place] [address]    Special Requests   Final    BOTTLES DRAWN AEROBIC AND ANAEROBIC Blood Culture results may not be optimal due to an excessive volume of blood received in culture bottles   Culture   Final    NO GROWTH < 12 HOURS Performed at [facility], [address] [place], [place] [address]    Report Status PENDING  Incomplete  SARS Coronavirus 2 (CEPHEID- Performed in [facility] hospital lab), Hosp Order     Status: Abnormal   Collection Time: 11/26/18  1:54 AM  Result Value Ref Range Status   SARS Coronavirus 2 POSITIVE (A) NEGATIVE Final    Comment: CRITICAL RESULT CALLED TO, READ BACK BY AND VERIFIED WITH: CAMPBELL,M @ 0344 ON [id number] BY POTEAT,S (NOTE) If result is NEGATIVE SARS-CoV-2 target nucleic acids are NOT DETECTED. The SARS-CoV-2 RNA is generally detectable in upper and lower  respiratory specimens during the acute phase of infection. The lowest  concentration of SARS-CoV-2 viral copies this assay can detect is 250  copies / mL. A negative result does not preclude SARS-CoV-2 infection  and should not be used as the sole basis for treatment or other  patient management decisions.  A negative result may occur with  improper specimen collection / handling, submission of specimen other  than nasopharyngeal swab, presence of viral mutation(s) within the  areas targeted by this assay, and inadequate number of viral copies  (<250 copies / mL). A  negative result must be combined with clinical  observations, patient history, and epidemiological information. If result is POSITIVE SARS-CoV-2 target nucleic acids are  DETECTED. The SARS-CoV-2 RNA is generally detectable in upper and lower  respiratory specimens during the acute phase of infection.  Positive  results are indicative of active infection with SARS-CoV-2.  Clinical  correlation with patient history and other diagnostic information is  necessary to determine patient infection status.  Positive results do  not rule out bacterial infection or co-infection with other viruses. If result is PRESUMPTIVE POSTIVE SARS-CoV-2 nucleic acids MAY BE PRESENT.   A presumptive positive result was obtained on the submitted specimen  and confirmed on repeat testing.  While 2019 novel coronavirus  (SARS-CoV-2) nucleic acids may be present in the  submitted sample  additional confirmatory testing may be necessary for epidemiological  and / or clinical management purposes  to differentiate between  SARS-CoV-2 and other Sarbecovirus currently known to infect humans.  If clinically indicated additional testing with an alternate test  methodology  [id number]) is advised. The SARS-CoV-2 RNA is generally  detectable in upper and lower respiratory specimens during the acute  phase of infection. The expected result is Negative. Fact Sheet for Patients:  BoilerBrush.com.cy Fact Sheet for Healthcare Providers: https://pope.com/ This test is not yet approved or cleared by the [place] FDA and has been authorized for detection and/or diagnosis of SARS-CoV-2 by FDA under an Emergency Use Authorization (EUA).  This EUA will remain in effect (meaning this test can be used) for the duration of the COVID-19 declaration under Section 564(b)(1) of the Act, 21 U.S.C. section 360bbb-3(b)(1), unless the authorization is terminated or revoked sooner. Performed  at [facility], [address], [place], [place] [address]       Radiology Studies: Ct Abdomen Pelvis W Contrast  Result Date: 11/25/2018 CLINICAL DATA:  Abdominal pain EXAM: CT ABDOMEN AND PELVIS WITH CONTRAST TECHNIQUE: Multidetector CT imaging of the abdomen and pelvis was performed using the standard protocol following bolus administration of intravenous contrast. CONTRAST:  OMNIPAQUE IOHEXOL 300 MG/ML  SOLN COMPARISON:  None. FINDINGS: Lower chest: Extensive, irregular ground-glass and heterogeneous opacity of the included bilateral lung bases. Hepatobiliary: No focal liver abnormality is seen. No gallstones, gallbladder wall thickening, or biliary dilatation. Pancreas: Unremarkable. No pancreatic ductal dilatation or surrounding inflammatory changes. Spleen: Normal in size without focal abnormality. Adrenals/Urinary Tract: Adrenal glands are unremarkable. Kidneys are normal, without renal calculi, focal lesion, or hydronephrosis. Bladder is unremarkable. Stomach/Bowel: Stomach is within normal limits. Appendix appears normal. Mild hyperenhancement of the distal descending colon, sigmoid, and rectum, with a somewhat featureless appearance (series 2, image 62, series 7, image 108). Vascular/Lymphatic: No significant vascular findings are present. No enlarged abdominal or pelvic lymph nodes. Reproductive: No mass or other abnormality. Other: No abdominal wall hernia or abnormality. No abdominopelvic ascites. Musculoskeletal: No acute or significant osseous findings. IMPRESSION: 1. Mild hyperenhancement of the distal descending colon, sigmoid, and rectum, with a somewhat featureless appearance (series 2, image 62, series 7, image 108). This appearance is consistent with nonspecific infectious or inflammatory proctocolitis, and particularly inflammatory bowel disease such as ulcerative colitis is a consideration with this appearance. 2. Extensive, irregular ground-glass and  heterogeneous opacity of the included bilateral lung bases, consistent with infection or aspiration. Electronically Signed   By: Lauralyn Primes M.D.   On: 11/25/2018 22:01   Dg Chest Port 1 View  Result Date: 11/25/2018 CLINICAL DATA:  Vomiting, diarrhea, history of cerebral palsy EXAM: PORTABLE CHEST 1 VIEW COMPARISON:  None. FINDINGS: Cardiomegaly. Left basilar heterogeneous opacity and probable small layering pleural effusion. IMPRESSION: Cardiomegaly. Left basilar heterogeneous opacity and probable small layering pleural effusion, concerning for infection or aspiration. Electronically Signed   By: Lauralyn Primes M.D.   On: 11/25/2018 19:39    Scheduled Meds:  enoxaparin (LOVENOX) injection  40 mg Subcutaneous Daily   mouth rinse  15 mL Mouth Rinse BID   Continuous Infusions:  sodium chloride 100 mL/hr at 11/26/18 0232   meropenem (MERREM) IV 1 g (11/26/18 0838)     LOS: 1 day   Assessment & Plan:   Principal Problem:   COVID-19 virus infection Active Problems:   Nausea vomiting and diarrhea   Colitis presumed infectious  Acute respiratory failure with hypoxia (HCC)   1. COVID-19 - confirmed on outpatient testing per mother's report, CT findings also c/w COVID, test was repeated here and she is positive again.  She is not hypoxic and not requiring oxygen.  Continue supportive care with IV fluids for that.  She has been having persistent fever.  She also has significantly elevated procalcitonin which raises the concern of possible bacterial superinfection so I am going to start her on Zosyn.  I and nursing coordinator had a long discussion with the patient and she agreed to transfer the patient to [facility].  2. N/V/D, colitis -could very well be a complication of COVID-19.  Procalcitonin elevated.  Started on Zosyn.  Continue IV fluids.   DVT prophyIntegris DeaconessStatus: Full code Family Communication: Mom at the bedside.  Discussed all plan of care with her.  Also  discussed the limitations of visitation at [facility] health facilities at this moment. Disposition Plan: Transfer to [facility].   Time spent: 25 MIN    Hughie Closs, MD [facility] Pager [phone]  If 7PM-7AM, please contact night-coverage [email or web address] Password [facility] 11/26/2018, 10:15 AM    ADDENDUM: Called receiving physician Dr. Thedore Mins at [facility] for signout and he requested to consult cardiology for further recommendations about colitis before patient can be transferred to [facility] due to lack of subspecialty support.  I consulted on-call GI from [facility] physicians and discussed the case with Dr. Bosie Clos who had mentioned that he would not see patient physically due to COVID positive status however he will take a look at patient's chart and imaging studies and would write a note with his thoughts detailed.  Waiting for GI to write their recommendations before transferring the patient.

## 2018-11-26 NOTE — Progress Notes (Signed)
33 year old female patient with history of cerebral palsy who was initially admitted to [facility] hospital for COVID-19 infection, diarrhea, CT evidence of pancolitis.  Was transferred here for further care after she was cleared from GI standpoint by Dr. Bosie Clos.  Patient was seen upon the insistence of the nursing staff with him half an hour of arrival, patient calm and quiet with sitter standing by the bedside.  Answered all my questions.  Hemodynamically stable.  Her only complaint was that her tummy hurts, I told her I will give her some medication and make her feel better.  Her abdominal exam so far is benign.  Will order a sitter to keep her calm, nurse was insistent that patient's mother be allowed in the room however this is not the current hospital policy and apparently the mother was not following any PPE and hygiene instructions at [facility] long and she would be a very high risk to staff and other patients here.  I will avoid that as much as possible.  I reassured the nurse that in my opinion patient can be managed with a sitter, I had already discussed this eventuality with the charge nurse on call prior to patient being transferred here, was also discussed with my supervising physician Dr. Sharon Seller and critical care physician Dr. Levy Pupa.  Both agreed that mother should not be allowed in the patient's room in lieu of her and staff safety.   Plan will be to treat her pancolitis with Zosyn which has been ordered, it will be continued, stool sample will be sent out for GI pathogen panel and C. difficile rule out.  From the COVID standpoint she does not seem to have any respiratory symptoms and is not hypoxic.  Standard COVID protocol, monitoring inflammatory markers etc. will be continued.  Continue present plan with the addition of narcotics for help, GI pathogen panel and C. difficile PCR, IV fluids for hydration along with other supportive care.

## 2018-11-27 LAB — COMPREHENSIVE METABOLIC PANEL
ALT: 25 U/L (ref 0–44)
AST: 34 U/L (ref 15–41)
Albumin: 2.2 g/dL — ABNORMAL LOW (ref 3.5–5.0)
Alkaline Phosphatase: 52 U/L (ref 38–126)
Anion gap: 7 (ref 5–15)
BUN: <5 mg/dL — ABNORMAL LOW (ref 6–20)
CO2: 26 mmol/L (ref 22–32)
Calcium: 8 mg/dL — ABNORMAL LOW (ref 8.9–10.3)
Chloride: 113 mmol/L — ABNORMAL HIGH (ref 98–111)
Creatinine, Ser: 0.62 mg/dL (ref 0.44–1.00)
GFR calc Af Amer: >60 mL/min (ref >60)
GFR calc non Af Amer: >60 mL/min (ref >60)
Glucose, Bld: 104 mg/dL — ABNORMAL HIGH (ref 70–99)
Potassium: 4 mmol/L (ref 3.5–5.1)
Sodium: 146 mmol/L — ABNORMAL HIGH (ref 135–145)
Total Bilirubin: 0.4 mg/dL (ref 0.3–1.2)
Total Protein: 5.8 g/dL — ABNORMAL LOW (ref 6.5–8.1)

## 2018-11-27 LAB — FERRITIN: Ferritin: 317 ng/mL — ABNORMAL HIGH (ref 11–307)

## 2018-11-27 LAB — CBC WITH DIFFERENTIAL/PLATELET
Abs Immature Granulocytes: 0.05 10*3/uL (ref 0.00–0.07)
Basophils Absolute: 0 10*3/uL (ref 0.0–0.1)
Basophils Relative: 0 %
Eosinophils Absolute: 0 10*3/uL (ref 0.0–0.5)
Eosinophils Relative: 0 %
HCT: 38.5 % (ref 36.0–46.0)
Hemoglobin: 11.7 g/dL — ABNORMAL LOW (ref 12.0–15.0)
Immature Granulocytes: 1 %
Lymphocytes Relative: 33 %
Lymphs Abs: 2 10*3/uL (ref 0.7–4.0)
MCH: 26.7 pg (ref 26.0–34.0)
MCHC: 30.4 g/dL (ref 30.0–36.0)
MCV: 87.7 fL (ref 80.0–100.0)
Monocytes Absolute: 0.2 10*3/uL (ref 0.1–1.0)
Monocytes Relative: 3 %
Neutro Abs: 3.8 10*3/uL (ref 1.7–7.7)
Neutrophils Relative %: 63 %
Platelets: 189 10*3/uL (ref 150–400)
RBC: 4.39 MIL/uL (ref 3.87–5.11)
RDW: 15.3 % (ref 11.5–15.5)
WBC: 6 10*3/uL (ref 4.0–10.5)
nRBC: 0 % (ref 0.0–0.2)

## 2018-11-27 LAB — INTERLEUKIN-6, PLASMA: Interleukin-6, Plasma: 102.2 pg/mL — ABNORMAL HIGH (ref 0.0–12.2)

## 2018-11-27 LAB — C-REACTIVE PROTEIN: CRP: 19.6 mg/dL — ABNORMAL HIGH (ref <1.0)

## 2018-11-27 LAB — C DIFFICILE QUICK SCREEN W PCR REFLEX
C Diff antigen: NEGATIVE
C Diff interpretation: NOT DETECTED
C Diff toxin: NEGATIVE

## 2018-11-27 LAB — MAGNESIUM: Magnesium: 2 mg/dL (ref 1.7–2.4)

## 2018-11-27 LAB — D-DIMER, QUANTITATIVE: D-Dimer, Quant: 1.01 ug/mL-FEU — ABNORMAL HIGH (ref 0.00–0.50)

## 2018-11-27 MED ORDER — SODIUM CHLORIDE 0.9 % IV SOLN
INTRAVENOUS | Status: DC | PRN
  Administered 2018-11-27: 10 mL via INTRAVENOUS

## 2018-11-27 MED ORDER — DEXTROSE 5 % IV SOLN
INTRAVENOUS | Status: DC
  Administered 2018-11-27 (×2): via INTRAVENOUS

## 2018-11-27 NOTE — Progress Notes (Signed)
PROGRESS NOTE                                                                                                                                                                                                             Patient Demographics:    Tina Oliver, is a 33 y.o. female, DOB - Apr 08, 1986, WUJ:[id number]  Outpatient Primary MD for the patient is Abigail Miyamoto, MD    LOS - 2  Admit date - 11/25/2018    Chief Complaint  Patient presents with   Dehydration    COVID-19 +       Brief Narrative  Tina Vanderbeck McGeeis a 33 y.o.femalewith medical history significant ofCP, MD, total blindness. Patient is cared for at home by parents. Father tested positive for COVID x11 days ago and has been ill at home. Patient has also tested positive for COVID-19 as an outpatient per mother. Patient has had fever, but little to no respiratory symptoms. Patient has developed intractable N/V/D at home out 1 week ago.    Subsequently developed some abdominal pain and cramping and was brought to [facility] on 11/26/2018 where she saw Dr. Bosie Clos GI who cleared her from GI standpoint and suggested that her GI CT findings were secondary to COVID-19 infection.  She was then transferred to [facility] later on 11/26/2018.   Subjective:    Tina Oliver today has, No headache, No chest pain, +ve mild generalized abdominal pain - No Nausea, No new weakness tingling or numbness, No Cough - SOB.     Assessment  & Plan :     1.  Acute Covid 19 Viral Illness during the ongoing 2020 Covid 19 Pandemic with primarily GI symptoms and CT evidence of colitis which is slightly unusual - No pulmonary symptoms or involvement, inflammatory markers are stable except CRP is moderately elevated and pro calcitonin is above 90.  Question if she has superimposed bacterial GI infection and colitis for which she has been placed on meropenem due to multiple  allergies.  She has been seen by GI physician Dr. Bosie Clos at [facility] on 11/26/2018 and he has cleared the patient from GI standpoint.  Stool studies and C. difficile PCR are pending.  Had detailed discussions with patient's mother, patient has had no past history of GI issues.  No exposure to antibiotics or  sick contacts recently except father tested positive for COVID-19 infection around 10 to 12 days prior to patient's hospital admission.   COVID-19 Labs  Recent Labs    11/26/18 0054 11/26/18 0624 11/27/18 0205  DDIMER 1.06* 1.45* 1.01*  FERRITIN  --   --  317*  CRP 19.9* 25.6* 19.6*    Lab Results  Component Value Date   SARSCOV2NAA POSITIVE (A) 11/26/2018    2.  Dehydration and hypernatremia  - due to #1 above, hydrate with D5W and monitor.  3.  Cerebral palsy, legally blind.  Supportive care.  Sitter if needed.  4.  Gastroenteritis.  Plan as a #1 above.  C. difficile PCR and stool studies pending.  Already seen and cleared by GI Dr. Bosie ClosSchooler on 11/26/2018 at [facility].    Condition - Extremely Guarded  Family Communication  :  Called mom 11/27/18  Code Status :  Full  Diet : Liquid  Disposition Plan  :  Stay on Tele  Consults  :  GI  Procedures  :    CT Abd -  1. Mild hyperenhancement of the distal descending colon, sigmoid, and rectum, with a somewhat featureless appearance (series 2, image 62, series 7, image 108). This appearance is consistent with nonspecific infectious or inflammatory proctocolitis, and particularly inflammatory bowel disease such as ulcerative colitis is a consideration with this appearance. 2. Extensive, irregular ground-glass and heterogeneous opacity of the included bilateral lung bases, consistent with infection or aspiration.  PUD Prophylaxis :   DVT Prophylaxis  :  Lovenox   Lab Results  Component Value Date   PLT 189 11/27/2018    Inpatient Medications  Scheduled Meds:  enoxaparin (LOVENOX) injection  40 mg  Subcutaneous Daily   mouth rinse  15 mL Mouth Rinse BID   Continuous Infusions:  dextrose     meropenem (MERREM) IV 1 g (11/27/18 0544)   PRN Meds:.acetaminophen, chlorpheniramine-HYDROcodone, guaiFENesin-dextromethorphan, morphine injection, ondansetron **OR** ondansetron (ZOFRAN) IV  Antibiotics  :    Anti-infectives (From admission, onward)   Start     Dose/Rate Route Frequency Ordered Stop   11/26/18 1600  meropenem (MERREM) 1 g in sodium chloride 0.9 % 100 mL IVPB     1 g 200 mL/hr over 30 Minutes Intravenous Every 8 hours 11/26/18 1552     11/26/18 0900  meropenem (MERREM) 1 g in sodium chloride 0.9 % 100 mL IVPB  Status:  Discontinued     1 g 200 mL/hr over 30 Minutes Intravenous Every 8 hours 11/26/18 0821 11/26/18 1539   11/25/18 2230  ciprofloxacin (CIPRO) IVPB 400 mg  Status:  Discontinued     400 mg 200 mL/hr over 60 Minutes Intravenous  Once 11/25/18 2226 11/25/18 2301   11/25/18 2230  metroNIDAZOLE (FLAGYL) IVPB 500 mg  Status:  Discontinued     500 mg 100 mL/hr over 60 Minutes Intravenous  Once 11/25/18 2226 11/25/18 2301       Time Spent in minutes  30   Susa RaringPrashant Navah Grondin M.D on 11/27/2018 at 10:01 AM  To page go to [email or web address] - password [email or web address]  [facility] -  Office  [phone]  See all Orders from today for further details    Objective:   Vitals:   11/26/18 2034 11/26/18 2307 11/27/18 0545 11/27/18 0800  BP: 107/61  107/61 116/78  Pulse: 75     Resp: 16  18 15   Temp: (!) 102.1 F (38.9 C) (!) 100.5 F (38.1 C) 99.2 F (37.3 C) 98.7  F (37.1 C)  TempSrc: Oral Oral  Oral  SpO2: 94%  97% 94%  Weight:      Height:        Wt Readings from Last 3 Encounters:  11/26/18 68.1 kg     Intake/Output Summary (Last 24 hours) at 11/27/2018 1001 Last data filed at 11/27/2018 1610 Gross per 24 hour  Intake 1182.93 ml  Output 875 ml  Net 307.93 ml     Physical Exam  Awake Alert,  No new F.N deficits, legally blind, Normal  affect [place].AT,PERRAL Supple Neck,No JVD, No cervical lymphadenopathy appriciated.  Symmetrical Chest wall movement, Good air movement bilaterally, CTAB RRR,No Gallops,Rubs or new Murmurs, No Parasternal Heave +ve B.Sounds, Abd Soft, mild generalized tenderness, No organomegaly appriciated, No rebound - guarding or rigidity. No Cyanosis, Clubbing or edema, No new Rash or bruise       Data Review:    CBC Recent Labs  Lab 11/25/18 1753 11/26/18 0624 11/27/18 0205  WBC 6.1 7.1 6.0  HGB 13.9 13.9 11.7*  HCT 42.7 45.0 38.5  PLT 174 180 189  MCV 84.9 86.4 87.7  MCH 27.6 26.7 26.7  MCHC 32.6 30.9 30.4  RDW 14.4 14.7 15.3  LYMPHSABS 1.1 1.7 2.0  MONOABS 0.2 0.2 0.2  EOSABS 0.0 0.0 0.0  BASOSABS 0.0 0.0 0.0    Chemistries  Recent Labs  Lab 11/25/18 1753 11/26/18 0624 11/27/18 0205  NA 137 141 146*  K 3.0* 4.0 4.0  CL 100 111 113*  CO2 23 20* 26  GLUCOSE 76 97 104*  BUN 7 <5* <5*  CREATININE 0.68 0.60 0.62  CALCIUM 8.1* 7.9* 8.0*  MG  --   --  2.0  AST 71* 60* 34  ALT 44 39 25  ALKPHOS 80 70 52  BILITOT 0.7 0.3 0.4   ------------------------------------------------------------------------------------------------------------------ No results for input(s): CHOL, HDL, LDLCALC, TRIG, CHOLHDL, LDLDIRECT in the last 72 hours.  No results found for: HGBA1C ------------------------------------------------------------------------------------------------------------------ No results for input(s): TSH, T4TOTAL, T3FREE, THYROIDAB in the last 72 hours.  Invalid input(s): FREET3  Cardiac Enzymes No results for input(s): CKMB, TROPONINI, MYOGLOBIN in the last 168 hours.  Invalid input(s): CK ------------------------------------------------------------------------------------------------------------------ No results found for: BNP  Micro Results Recent Results (from the past 240 hour(s))  Blood culture (routine x 2)     Status: None (Preliminary result)   Collection Time:  11/25/18  5:58 PM  Result Value Ref Range Status   Specimen Description   Final    BLOOD RIGHT ARM Performed at [facility], [address], [place], [place] [address]    Special Requests   Final    BOTTLES DRAWN AEROBIC AND ANAEROBIC Blood Culture results may not be optimal due to an excessive volume of blood received in culture bottles   Culture   Final    NO GROWTH < 12 HOURS Performed at [facility], [address], [place], [place] [address]    Report Status PENDING  Incomplete  SARS Coronavirus 2 (CEPHEID- Performed in [facility] hospital lab), Hosp Order     Status: Abnormal   Collection Time: 11/26/18  1:54 AM  Result Value Ref Range Status   SARS Coronavirus 2 POSITIVE (A) NEGATIVE Final    Comment: CRITICAL RESULT CALLED TO, READ BACK BY AND VERIFIED WITH: CAMPBELL,M @ 0344 ON 191478 BY POTEAT,S (NOTE) If result is NEGATIVE SARS-CoV-2 target nucleic acids are NOT DETECTED. The SARS-CoV-2 RNA is generally detectable in upper and lower  respiratory specimens  during the acute phase of infection. The lowest  concentration of SARS-CoV-2 viral copies this assay can detect is 250  copies / mL. A negative result does not preclude SARS-CoV-2 infection  and should not be used as the sole basis for treatment or other  patient management decisions.  A negative result may occur with  improper specimen collection / handling, submission of specimen other  than nasopharyngeal swab, presence of viral mutation(s) within the  areas targeted by this assay, and inadequate number of viral copies  (<250 copies / mL). A negative result must be combined with clinical  observations, patient history, and epidemiological information. If result is POSITIVE SARS-CoV-2 target nucleic acids are  DETECTED. The SARS-CoV-2 RNA is generally detectable in upper and lower  respiratory specimens during the acute phase of infection.  Positive  results are indicative of active  infection with SARS-CoV-2.  Clinical  correlation with patient history and other diagnostic information is  necessary to determine patient infection status.  Positive results do  not rule out bacterial infection or co-infection with other viruses. If result is PRESUMPTIVE POSTIVE SARS-CoV-2 nucleic acids MAY BE PRESENT.   A presumptive positive result was obtained on the submitted specimen  and confirmed on repeat testing.  While 2019 novel coronavirus  (SARS-CoV-2) nucleic acids may be present in the submitted sample  additional confirmatory testing may be necessary for epidemiological  and / or clinical management purposes  to differentiate between  SARS-CoV-2 and other Sarbecovirus currently known to infect humans.  If clinically indicated additional testing with an alternate test  methodology  [phone]) is advised. The SARS-CoV-2 RNA is generally  detectable in upper and lower respiratory specimens during the acute  phase of infection. The expected result is Negative. Fact Sheet for Patients:  BoilerBrush.com.cy Fact Sheet for Healthcare Providers: https://pope.com/ This test is not yet approved or cleared by the [place] FDA and has been authorized for detection and/or diagnosis of SARS-CoV-2 by FDA under an Emergency Use Authorization (EUA).  This EUA will remain in effect (meaning this test can be used) for the duration of the COVID-19 declaration under Section 564(b)(1) of the Act, 21 U.S.C. section 360bbb-3(b)(1), unless the authorization is terminated or revoked sooner. Performed at [facility], [address], [place], [place] [address]     Radiology Reports Ct Abdomen Pelvis W Contrast  Result Date: 11/25/2018 CLINICAL DATA:  Abdominal pain EXAM: CT ABDOMEN AND PELVIS WITH CONTRAST TECHNIQUE: Multidetector CT imaging of the abdomen and pelvis was performed using the standard protocol following  bolus administration of intravenous contrast. CONTRAST:  OMNIPAQUE IOHEXOL 300 MG/ML  SOLN COMPARISON:  None. FINDINGS: Lower chest: Extensive, irregular ground-glass and heterogeneous opacity of the included bilateral lung bases. Hepatobiliary: No focal liver abnormality is seen. No gallstones, gallbladder wall thickening, or biliary dilatation. Pancreas: Unremarkable. No pancreatic ductal dilatation or surrounding inflammatory changes. Spleen: Normal in size without focal abnormality. Adrenals/Urinary Tract: Adrenal glands are unremarkable. Kidneys are normal, without renal calculi, focal lesion, or hydronephrosis. Bladder is unremarkable. Stomach/Bowel: Stomach is within normal limits. Appendix appears normal. Mild hyperenhancement of the distal descending colon, sigmoid, and rectum, with a somewhat featureless appearance (series 2, image 62, series 7, image 108). Vascular/Lymphatic: No significant vascular findings are present. No enlarged abdominal or pelvic lymph nodes. Reproductive: No mass or other abnormality. Other: No abdominal wall hernia or abnormality. No abdominopelvic ascites. Musculoskeletal: No acute or significant osseous findings. IMPRESSION: 1. Mild hyperenhancement of the distal descending colon,  sigmoid, and rectum, with a somewhat featureless appearance (series 2, image 62, series 7, image 108). This appearance is consistent with nonspecific infectious or inflammatory proctocolitis, and particularly inflammatory bowel disease such as ulcerative colitis is a consideration with this appearance. 2. Extensive, irregular ground-glass and heterogeneous opacity of the included bilateral lung bases, consistent with infection or aspiration. Electronically Signed   By: Lauralyn Primes M.D.   On: 11/25/2018 22:01   Dg Chest Port 1 View  Result Date: 11/25/2018 CLINICAL DATA:  Vomiting, diarrhea, history of cerebral palsy EXAM: PORTABLE CHEST 1 VIEW COMPARISON:  None. FINDINGS: Cardiomegaly. Left  basilar heterogeneous opacity and probable small layering pleural effusion. IMPRESSION: Cardiomegaly. Left basilar heterogeneous opacity and probable small layering pleural effusion, concerning for infection or aspiration. Electronically Signed   By: Lauralyn Primes M.D.   On: 11/25/2018 19:39

## 2018-11-27 NOTE — Care Management (Signed)
Case manager will continue to follow patient for disposition as she medically improves. May God bless her to do so.   Vance Peper, RN BSN Case Manager [phone]

## 2018-11-28 ENCOUNTER — Inpatient Hospital Stay (HOSPITAL_COMMUNITY): Payer: Medicaid Other

## 2018-11-28 LAB — COMPREHENSIVE METABOLIC PANEL
ALT: 22 U/L (ref 0–44)
AST: 28 U/L (ref 15–41)
Albumin: 2.4 g/dL — ABNORMAL LOW (ref 3.5–5.0)
Alkaline Phosphatase: 50 U/L (ref 38–126)
Anion gap: 8 (ref 5–15)
BUN: <5 mg/dL — ABNORMAL LOW (ref 6–20)
CO2: 26 mmol/L (ref 22–32)
Calcium: 8 mg/dL — ABNORMAL LOW (ref 8.9–10.3)
Chloride: 105 mmol/L (ref 98–111)
Creatinine, Ser: 0.49 mg/dL (ref 0.44–1.00)
GFR calc Af Amer: >60 mL/min (ref >60)
GFR calc non Af Amer: >60 mL/min (ref >60)
Glucose, Bld: 116 mg/dL — ABNORMAL HIGH (ref 70–99)
Potassium: 3.2 mmol/L — ABNORMAL LOW (ref 3.5–5.1)
Sodium: 139 mmol/L (ref 135–145)
Total Bilirubin: 0.3 mg/dL (ref 0.3–1.2)
Total Protein: 6.1 g/dL — ABNORMAL LOW (ref 6.5–8.1)

## 2018-11-28 LAB — CBC WITH DIFFERENTIAL/PLATELET
Abs Immature Granulocytes: 0.02 10*3/uL (ref 0.00–0.07)
Basophils Absolute: 0 10*3/uL (ref 0.0–0.1)
Basophils Relative: 0 %
Eosinophils Absolute: 0 10*3/uL (ref 0.0–0.5)
Eosinophils Relative: 1 %
HCT: 37.8 % (ref 36.0–46.0)
Hemoglobin: 11.9 g/dL — ABNORMAL LOW (ref 12.0–15.0)
Immature Granulocytes: 1 %
Lymphocytes Relative: 53 %
Lymphs Abs: 2.3 10*3/uL (ref 0.7–4.0)
MCH: 26.7 pg (ref 26.0–34.0)
MCHC: 31.5 g/dL (ref 30.0–36.0)
MCV: 84.8 fL (ref 80.0–100.0)
Monocytes Absolute: 0.2 10*3/uL (ref 0.1–1.0)
Monocytes Relative: 5 %
Neutro Abs: 1.7 10*3/uL (ref 1.7–7.7)
Neutrophils Relative %: 40 %
Platelets: 221 10*3/uL (ref 150–400)
RBC: 4.46 MIL/uL (ref 3.87–5.11)
RDW: 15.2 % (ref 11.5–15.5)
WBC: 4.2 10*3/uL (ref 4.0–10.5)
nRBC: 0 % (ref 0.0–0.2)

## 2018-11-28 LAB — PROCALCITONIN: Procalcitonin: 15.75 ng/mL

## 2018-11-28 LAB — FERRITIN: Ferritin: 337 ng/mL — ABNORMAL HIGH (ref 11–307)

## 2018-11-28 LAB — MAGNESIUM: Magnesium: 2 mg/dL (ref 1.7–2.4)

## 2018-11-28 LAB — D-DIMER, QUANTITATIVE: D-Dimer, Quant: 1.43 ug/mL-FEU — ABNORMAL HIGH (ref 0.00–0.50)

## 2018-11-28 LAB — C-REACTIVE PROTEIN: CRP: 10.9 mg/dL — ABNORMAL HIGH (ref <1.0)

## 2018-11-28 MED ORDER — METRONIDAZOLE IN NACL 5-0.79 MG/ML-% IV SOLN
500.0000 mg | Freq: Three times a day (TID) | INTRAVENOUS | Status: DC
  Administered 2018-11-28 – 2018-12-01 (×8): 500 mg via INTRAVENOUS
  Filled 2018-11-28 (×11): qty 100

## 2018-11-28 MED ORDER — SODIUM CHLORIDE 0.9 % IV SOLN
2.0000 g | INTRAVENOUS | Status: DC
  Administered 2018-11-28 – 2018-11-30 (×3): 2 g via INTRAVENOUS
  Filled 2018-11-28: qty 2
  Filled 2018-11-28 (×2): qty 20
  Filled 2018-11-28: qty 2

## 2018-11-28 MED ORDER — METRONIDAZOLE 500 MG PO TABS
500.0000 mg | ORAL_TABLET | Freq: Three times a day (TID) | ORAL | Status: DC
  Administered 2018-11-28: 500 mg via ORAL
  Filled 2018-11-28 (×4): qty 1

## 2018-11-28 MED ORDER — POTASSIUM CHLORIDE 2 MEQ/ML IV SOLN
INTRAVENOUS | Status: DC
  Administered 2018-11-28: 10:00:00 via INTRAVENOUS
  Filled 2018-11-28 (×5): qty 1000

## 2018-11-28 MED ORDER — SODIUM CHLORIDE 0.9 % IV BOLUS
500.0000 mL | Freq: Once | INTRAVENOUS | Status: AC
  Administered 2018-11-28: 13:00:00 500 mL via INTRAVENOUS

## 2018-11-28 MED ORDER — POTASSIUM CHLORIDE 20 MEQ/15ML (10%) PO SOLN
40.0000 meq | Freq: Once | ORAL | Status: AC
  Administered 2018-11-28: 40 meq via ORAL
  Filled 2018-11-28: qty 30

## 2018-11-28 NOTE — Progress Notes (Signed)
Pharmacy Antibiotic Note  Tina Oliver is a 33 y.o. female admitted on 11/25/2018 with COVID and now CT abdomen concerning for intra-abdominal infection.    She was started on merrem empirically due to questionable allgeries. Ceclor>>N/V, childhood PCN with rash. D/w Dr. Thedore Mins and we will change to ceftriaxone and metronidazole. PCT is elevated. Cultures remains neg.   Plan: Dc Merrem Ceftriaxone 2g IV q24 Flagyl 500mg  PO TID  Height: 5\' 3"  (160 cm) Weight: 150 lb 2.1 oz (68.1 kg) IBW/kg (Calculated) : 52.4  Temp (24hrs), Avg:98.8 F (37.1 C), Min:97.7 F (36.5 C), Max:101.2 F (38.4 C)  Recent Labs  Lab 11/25/18 1753 11/26/18 0624 11/27/18 0205 11/28/18 0454  WBC 6.1 7.1 6.0 4.2  CREATININE 0.68 0.60 0.62 0.49  LATICACIDVEN 1.0 1.9  --   --     Estimated Creatinine Clearance: 94.4 mL/min (by C-G formula based on SCr of 0.49 mg/dL).    Allergies  Allergen Reactions  . Ceclor [Cefaclor] Nausea And Vomiting  . Amoxicillin Rash    Mild rash as a young child. Unknown cephalosporin history/not tried    Antimicrobials this admission: Meropenem 5/16 >> 5/18 Ceftriaxone 5/18>> Metronidazole 5/18>>   Dose adjustments this admission:   Microbiology results: 5/16 BCx:  5/17 c.diff>>neg   Ulyses Southward, PharmD, BCIDP, AAHIVP, CPP Infectious Disease Pharmacist 11/28/2018 12:01 PM

## 2018-11-28 NOTE — Progress Notes (Signed)
Patient's mother updated over the phone twice.  Patient able to produce specimen, without needing flexiseal.  Blood pressure much improved after bolus.

## 2018-11-28 NOTE — Progress Notes (Signed)
Patient's blood pressure low at 91/64.  Md notified with order for a bolus.

## 2018-11-28 NOTE — Progress Notes (Signed)
PROGRESS NOTE                                                                                                                                                                                                             Patient Demographics:    Tina Oliver, is a 33 y.o. female, DOB - 1986-03-05, CWU:[id number]  Outpatient Primary MD for the patient is Abigail Miyamoto, MD    LOS - 3  Admit date - 11/25/2018    Chief Complaint  Patient presents with   Dehydration    COVID-19 +       Brief Narrative  Tina Mcaloon McGeeis a 33 y.o.femalewith medical history significant ofCP, MD, total blindness. Patient is cared for at home by parents. Father tested positive for COVID x11 days ago and has been ill at home. Patient has also tested positive for COVID-19 as an outpatient per mother. Patient has had fever, but little to no respiratory symptoms. Patient has developed intractable N/V/D at home out 1 week ago.    Subsequently developed some abdominal pain and cramping and was brought to [facility] on 11/26/2018 where she saw Dr. Bosie Clos GI who cleared her from GI standpoint and suggested that her GI CT findings were secondary to COVID-19 infection.  She was then transferred to [facility] later on 11/26/2018.   Subjective:   Patient in bed, appears comfortable, denies any headache, no fever, no chest pain or pressure, no shortness of breath , still has generalized abdominal pain. No focal weakness.     Assessment  & Plan :     1.  Acute Covid 19 Viral Illness during the ongoing 2020 Covid 19 Pandemic with primarily GI symptoms and CT evidence of colitis which is slightly unusual - No pulmonary symptoms or involvement, inflammatory markers are stable except CRP is moderately elevated and pro calcitonin is above 90.  Question if she has superimposed bacterial GI infection and colitis for which she has been placed on  meropenem due to multiple allergies.  She has been seen by GI physician Dr. Bosie Clos at [facility] on 11/26/2018 and he has cleared the patient from GI standpoint.  Till having diarrhea, C. difficile PCR negative, GI pathogen panel is pending requested the staff to collect a specimen for sure today on 11/28/2018.  As discussed with GI physician  Dr. Bosie Clos on 11/28/2018, he wants to refrain from any immunomodulators or steroids still GI pathogen panel rules out an infectious etiology there after he is open for steroid use if needed.  From pulmonary standpoint she is stable.  Continue supportive care for abdominal pain and gastroenteritis, trend procalcitonin and follow GI pathogen panel.  Had detailed discussions with patient's mother on 11/27/2018, patient has had no past history of GI issues.  No exposure to antibiotics or sick contacts recently except father tested positive for COVID-19 infection around 10 to 12 days prior to patient's hospital admission.   COVID-19 Labs  Recent Labs    11/26/18 0624 11/27/18 0205 11/28/18 0454  DDIMER 1.45* 1.01* 1.43*  FERRITIN  --  317* 337*  CRP 25.6* 19.6* 10.9*    Lab Results  Component Value Date   SARSCOV2NAA POSITIVE (A) 11/26/2018    2.  Dehydration and hypernatremia  - due to #1 above, improved with hydration, continue gentle IV fluids.  3.  Cerebral palsy, legally blind.  Supportive care.  Sitter if needed.  4.  Gastroenteritis.  Plan as a #1 above.  C. difficile PCR is negative, GI pathogen panel pending.  5.  Hypokalemia.  Due to diarrhea.  Replaced and monitor.     Condition - Extremely Guarded  Family Communication  :  Called mom 11/27/18  Code Status :  Full  Diet : Liquid  Disposition Plan  :  Stay on Tele  Consults  :  GI  Procedures  :    CT Abd -  1. Mild hyperenhancement of the distal descending colon, sigmoid, and rectum, with a somewhat featureless appearance (series 2, image 62, series 7, image 108).  This appearance is consistent with nonspecific infectious or inflammatory proctocolitis, and particularly inflammatory bowel disease such as ulcerative colitis is a consideration with this appearance. 2. Extensive, irregular ground-glass and heterogeneous opacity of the included bilateral lung bases, consistent with infection or aspiration.  PUD Prophylaxis :   DVT Prophylaxis  :  Lovenox   Lab Results  Component Value Date   PLT 221 11/28/2018    Inpatient Medications  Scheduled Meds:  enoxaparin (LOVENOX) injection  40 mg Subcutaneous Daily   mouth rinse  15 mL Mouth Rinse BID   Continuous Infusions:  lactated ringers with kcl 75 mL/hr at 11/28/18 1001   meropenem (MERREM) IV 1 g (11/28/18 0600)   PRN Meds:.acetaminophen, chlorpheniramine-HYDROcodone, guaiFENesin-dextromethorphan, morphine injection, [DISCONTINUED] ondansetron **OR** ondansetron (ZOFRAN) IV  Antibiotics  :    Anti-infectives (From admission, onward)   Start     Dose/Rate Route Frequency Ordered Stop   11/26/18 1600  meropenem (MERREM) 1 g in sodium chloride 0.9 % 100 mL IVPB     1 g 200 mL/hr over 30 Minutes Intravenous Every 8 hours 11/26/18 1552     11/26/18 0900  meropenem (MERREM) 1 g in sodium chloride 0.9 % 100 mL IVPB  Status:  Discontinued     1 g 200 mL/hr over 30 Minutes Intravenous Every 8 hours 11/26/18 0821 11/26/18 1539   11/25/18 2230  ciprofloxacin (CIPRO) IVPB 400 mg  Status:  Discontinued     400 mg 200 mL/hr over 60 Minutes Intravenous  Once 11/25/18 2226 11/25/18 2301   11/25/18 2230  metroNIDAZOLE (FLAGYL) IVPB 500 mg  Status:  Discontinued     500 mg 100 mL/hr over 60 Minutes Intravenous  Once 11/25/18 2226 11/25/18 2301       Time Spent in minutes  30  Susa Raring M.D on 11/28/2018 at 10:48 AM  To page go to [email or web address] - password [facility]  [facility] -  Office  [phone]  See all Orders from today for further details    Objective:   Vitals:   11/27/18  2241 11/28/18 0002 11/28/18 0427 11/28/18 0805  BP: 115/76 120/72 118/77 102/71  Pulse: 69 72 61 61  Resp: Temp: 98 F (36.7 C) 97.7 F (36.5 C) 97.9 F (36.6 C) 99 F (37.2 C)  TempSrc: Oral Oral Oral Axillary  SpO2: 95% 93% 96% 98%  Weight:      Height:        Wt Readings from Last 3 Encounters:  11/26/18 68.1 kg     Intake/Output Summary (Last 24 hours) at 11/28/2018 1048 Last data filed at 11/28/2018 0912 Gross per 24 hour  Intake 1192.79 ml  Output 1500 ml  Net -307.21 ml     Physical Exam  Awake Alert, legally blind, No new F.N deficits, Normal affect [place].AT,PERRAL Supple Neck,No JVD, No cervical lymphadenopathy appriciated.  Symmetrical Chest wall movement, Good air movement bilaterally, CTAB RRR,No Gallops, Rubs or new Murmurs, No Parasternal Heave +ve B.Sounds, Abd Soft, mild generalized tenderness, No organomegaly appriciated, No rebound - guarding or rigidity. No Cyanosis, Clubbing or edema, No new Rash or bruise      Data Review:    CBC Recent Labs  Lab 11/25/18 1753 11/26/18 0624 11/27/18 0205 11/28/18 0454  WBC 6.1 7.1 6.0 4.2  HGB 13.9 13.9 11.7* 11.9*  HCT 42.7 45.0 38.5 37.8  PLT 174 180 189 221  MCV 84.9 86.4 87.7 84.8  MCH 27.6 26.7 26.7 26.7  MCHC 32.6 30.9 30.4 31.5  RDW 14.4 14.7 15.3 15.2  LYMPHSABS 1.1 1.7 2.0 2.3  MONOABS 0.2 0.2 0.2 0.2  EOSABS 0.0 0.0 0.0 0.0  BASOSABS 0.0 0.0 0.0 0.0    Chemistries  Recent Labs  Lab 11/25/18 1753 11/26/18 0624 11/27/18 0205 11/28/18 0454  NA 137 141 146* 139  K 3.0* 4.0 4.0 3.2*  CL 100 111 113* 105  CO2 23 20* 26 26  GLUCOSE 76 97 104* 116*  BUN 7 <5* <5* <5*  CREATININE 0.68 0.60 0.62 0.49  CALCIUM 8.1* 7.9* 8.0* 8.0*  MG  --   --  2.0 2.0  AST 71* 60* 34 28  ALT 44 39 25 22  ALKPHOS 80 70 52 50  BILITOT 0.7 0.3 0.4 0.3   ------------------------------------------------------------------------------------------------------------------ No results for input(s):  CHOL, HDL, LDLCALC, TRIG, CHOLHDL, LDLDIRECT in the last 72 hours.  No results found for: HGBA1C ------------------------------------------------------------------------------------------------------------------ No results for input(s): TSH, T4TOTAL, T3FREE, THYROIDAB in the last 72 hours.  Invalid input(s): FREET3  Cardiac Enzymes No results for input(s): CKMB, TROPONINI, MYOGLOBIN in the last 168 hours.  Invalid input(s): CK ------------------------------------------------------------------------------------------------------------------ No results found for: BNP  Micro Results Recent Results (from the past 240 hour(s))  Blood culture (routine x 2)     Status: None (Preliminary result)   Collection Time: 11/25/18  5:58 PM  Result Value Ref Range Status   Specimen Description   Final    BLOOD RIGHT ARM Performed at [facility], [address], [place], [place] [address]    Special Requests   Final    BOTTLES DRAWN AEROBIC AND ANAEROBIC Blood Culture results may not be optimal due to an excessive volume of blood received in culture bottles   Culture   Final    NO GROWTH  2 DAYS Performed at [facility], [address], [place], [place] [address]    Report Status PENDING  Incomplete  SARS Coronavirus 2 (CEPHEID- Performed in [facility] hospital lab), Hosp Order     Status: Abnormal   Collection Time: 11/26/18  1:54 AM  Result Value Ref Range Status   SARS Coronavirus 2 POSITIVE (A) NEGATIVE Final    Comment: CRITICAL RESULT CALLED TO, READ BACK BY AND VERIFIED WITH: CAMPBELL,M @ 0344 ON [id number] BY POTEAT,S (NOTE) If result is NEGATIVE SARS-CoV-2 target nucleic acids are NOT DETECTED. The SARS-CoV-2 RNA is generally detectable in upper and lower  respiratory specimens during the acute phase of infection. The lowest  concentration of SARS-CoV-2 viral copies this assay can detect is 250  copies / mL. A negative result does not preclude SARS-CoV-2  infection  and should not be used as the sole basis for treatment or other  patient management decisions.  A negative result may occur with  improper specimen collection / handling, submission of specimen other  than nasopharyngeal swab, presence of viral mutation(s) within the  areas targeted by this assay, and inadequate number of viral copies  (<250 copies / mL). A negative result must be combined with clinical  observations, patient history, and epidemiological information. If result is POSITIVE SARS-CoV-2 target nucleic acids are  DETECTED. The SARS-CoV-2 RNA is generally detectable in upper and lower  respiratory specimens during the acute phase of infection.  Positive  results are indicative of active infection with SARS-CoV-2.  Clinical  correlation with patient history and other diagnostic information is  necessary to determine patient infection status.  Positive results do  not rule out bacterial infection or co-infection with other viruses. If result is PRESUMPTIVE POSTIVE SARS-CoV-2 nucleic acids MAY BE PRESENT.   A presumptive positive result was obtained on the submitted specimen  and confirmed on repeat testing.  While 2019 novel coronavirus  (SARS-CoV-2) nucleic acids may be present in the submitted sample  additional confirmatory testing may be necessary for epidemiological  and / or clinical management purposes  to differentiate between  SARS-CoV-2 and other Sarbecovirus currently known to infect humans.  If clinically indicated additional testing with an alternate test  methodology  [phone]) is advised. The SARS-CoV-2 RNA is generally  detectable in upper and lower respiratory specimens during the acute  phase of infection. The expected result is Negative. Fact Sheet for Patients:  BoilerBrush.com.cy Fact Sheet for Healthcare Providers: https://pope.com/ This test is not yet approved or cleared by the [place]  FDA and has been authorized for detection and/or diagnosis of SARS-CoV-2 by FDA under an Emergency Use Authorization (EUA).  This EUA will remain in effect (meaning this test can be used) for the duration of the COVID-19 declaration under Section 564(b)(1) of the Act, 21 U.S.C. section 360bbb-3(b)(1), unless the authorization is terminated or revoked sooner. Performed at [facility], [address], [place], [place] [address]   Blood culture (routine x 2)     Status: None (Preliminary result)   Collection Time: 11/26/18  6:24 AM  Result Value Ref Range Status   Specimen Description   Final    BLOOD RIGHT HAND Performed at [facility], [address], [place], [place] [address]    Special Requests   Final    BOTTLES DRAWN AEROBIC ONLY Blood Culture adequate volume Performed at [facility], [address] [place], [place] [address]    Culture  Final    NO GROWTH 1 DAY Performed at [facility], [address] [place], [place] [address]    Report Status PENDING  Incomplete  C difficile quick scan w PCR reflex     Status: None   Collection Time: 11/27/18 10:45 AM  Result Value Ref Range Status   C Diff antigen NEGATIVE NEGATIVE Final   C Diff toxin NEGATIVE NEGATIVE Final   C Diff interpretation No C. difficile detected.  Final    Comment: Performed at [facility], [address] [place], [place] [address]    Radiology Reports Ct Abdomen Pelvis W Contrast  Result Date: 11/25/2018 CLINICAL DATA:  Abdominal pain EXAM: CT ABDOMEN AND PELVIS WITH CONTRAST TECHNIQUE: Multidetector CT imaging of the abdomen and pelvis was performed using the standard protocol following bolus administration of intravenous contrast. CONTRAST:  100mL OMNIPAQUE IOHEXOL 300 MG/ML  SOLN COMPARISON:  None. FINDINGS: Lower chest: Extensive, irregular ground-glass and heterogeneous opacity of the included bilateral lung  bases. Hepatobiliary: No focal liver abnormality is seen. No gallstones, gallbladder wall thickening, or biliary dilatation. Pancreas: Unremarkable. No pancreatic ductal dilatation or surrounding inflammatory changes. Spleen: Normal in size without focal abnormality. Adrenals/Urinary Tract: Adrenal glands are unremarkable. Kidneys are normal, without renal calculi, focal lesion, or hydronephrosis. Bladder is unremarkable. Stomach/Bowel: Stomach is within normal limits. Appendix appears normal. Mild hyperenhancement of the distal descending colon, sigmoid, and rectum, with a somewhat featureless appearance (series 2, image 62, series 7, image 108). Vascular/Lymphatic: No significant vascular findings are present. No enlarged abdominal or pelvic lymph nodes. Reproductive: No mass or other abnormality. Other: No abdominal wall hernia or abnormality. No abdominopelvic ascites. Musculoskeletal: No acute or significant osseous findings. IMPRESSION: 1. Mild hyperenhancement of the distal descending colon, sigmoid, and rectum, with a somewhat featureless appearance (series 2, image 62, series 7, image 108). This appearance is consistent with nonspecific infectious or inflammatory proctocolitis, and particularly inflammatory bowel disease such as ulcerative colitis is a consideration with this appearance. 2. Extensive, irregular ground-glass and heterogeneous opacity of the included bilateral lung bases, consistent with infection or aspiration. Electronically Signed   By: Lauralyn PrimesAlex  Bibbey M.D.   On: 11/25/2018 22:01   Dg Chest Port 1 View  Result Date: 11/25/2018 CLINICAL DATA:  Vomiting, diarrhea, history of cerebral palsy EXAM: PORTABLE CHEST 1 VIEW COMPARISON:  None. FINDINGS: Cardiomegaly. Left basilar heterogeneous opacity and probable small layering pleural effusion. IMPRESSION: Cardiomegaly. Left basilar heterogeneous opacity and probable small layering pleural effusion, concerning for infection or aspiration.  Electronically Signed   By: Lauralyn PrimesAlex  Bibbey M.D.   On: 11/25/2018 19:39

## 2018-11-29 ENCOUNTER — Inpatient Hospital Stay (HOSPITAL_COMMUNITY): Payer: Medicaid Other

## 2018-11-29 LAB — GASTROINTESTINAL PANEL BY PCR, STOOL (REPLACES STOOL CULTURE)

## 2018-11-29 LAB — D-DIMER, QUANTITATIVE: D-Dimer, Quant: 0.99 ug/mL-FEU — ABNORMAL HIGH (ref 0.00–0.50)

## 2018-11-29 LAB — CBC WITH DIFFERENTIAL/PLATELET
Abs Immature Granulocytes: 0.02 10*3/uL (ref 0.00–0.07)
Basophils Absolute: 0 10*3/uL (ref 0.0–0.1)
Basophils Relative: 0 %
Eosinophils Absolute: 0 10*3/uL (ref 0.0–0.5)
Eosinophils Relative: 1 %
HCT: 34.9 % — ABNORMAL LOW (ref 36.0–46.0)
Hemoglobin: 11.2 g/dL — ABNORMAL LOW (ref 12.0–15.0)
Immature Granulocytes: 1 %
Lymphocytes Relative: 45 %
Lymphs Abs: 1.4 10*3/uL (ref 0.7–4.0)
MCH: 27.3 pg (ref 26.0–34.0)
MCHC: 32.1 g/dL (ref 30.0–36.0)
MCV: 84.9 fL (ref 80.0–100.0)
Monocytes Absolute: 0.3 10*3/uL (ref 0.1–1.0)
Monocytes Relative: 10 %
Neutro Abs: 1.3 10*3/uL — ABNORMAL LOW (ref 1.7–7.7)
Neutrophils Relative %: 43 %
Platelets: 266 10*3/uL (ref 150–400)
RBC: 4.11 MIL/uL (ref 3.87–5.11)
RDW: 15.5 % (ref 11.5–15.5)
WBC: 3.1 10*3/uL — ABNORMAL LOW (ref 4.0–10.5)
nRBC: 0 % (ref 0.0–0.2)

## 2018-11-29 LAB — COMPREHENSIVE METABOLIC PANEL
ALT: 17 U/L (ref 0–44)
AST: 20 U/L (ref 15–41)
Albumin: 2 g/dL — ABNORMAL LOW (ref 3.5–5.0)
Alkaline Phosphatase: 46 U/L (ref 38–126)
Anion gap: 8 (ref 5–15)
BUN: <5 mg/dL — ABNORMAL LOW (ref 6–20)
CO2: 26 mmol/L (ref 22–32)
Calcium: 8.3 mg/dL — ABNORMAL LOW (ref 8.9–10.3)
Chloride: 109 mmol/L (ref 98–111)
Creatinine, Ser: 0.55 mg/dL (ref 0.44–1.00)
GFR calc Af Amer: >60 mL/min (ref >60)
GFR calc non Af Amer: >60 mL/min (ref >60)
Glucose, Bld: 81 mg/dL (ref 70–99)
Potassium: 4.3 mmol/L (ref 3.5–5.1)
Sodium: 143 mmol/L (ref 135–145)
Total Bilirubin: 0.3 mg/dL (ref 0.3–1.2)
Total Protein: 5.5 g/dL — ABNORMAL LOW (ref 6.5–8.1)

## 2018-11-29 LAB — FERRITIN: Ferritin: 280 ng/mL (ref 11–307)

## 2018-11-29 LAB — PROCALCITONIN: Procalcitonin: 5.2 ng/mL

## 2018-11-29 LAB — MAGNESIUM: Magnesium: 1.9 mg/dL (ref 1.7–2.4)

## 2018-11-29 LAB — C-REACTIVE PROTEIN: CRP: 6.5 mg/dL — ABNORMAL HIGH (ref <1.0)

## 2018-11-29 MED ORDER — FLUCONAZOLE 100MG IVPB
100.0000 mg | Freq: Once | INTRAVENOUS | Status: AC
  Administered 2018-11-29: 100 mg via INTRAVENOUS
  Filled 2018-11-29: qty 50

## 2018-11-29 MED ORDER — NYSTATIN 100000 UNIT/ML MT SUSP
5.0000 mL | Freq: Four times a day (QID) | OROMUCOSAL | Status: DC
  Administered 2018-11-29 – 2018-12-01 (×7): 500000 [IU] via ORAL
  Filled 2018-11-29 (×14): qty 5

## 2018-11-29 MED ORDER — MORPHINE SULFATE (PF) 2 MG/ML IV SOLN
1.0000 mg | INTRAVENOUS | Status: DC | PRN
  Administered 2018-11-29 – 2018-12-01 (×5): 1 mg via INTRAVENOUS
  Filled 2018-11-29 (×5): qty 1

## 2018-11-29 NOTE — Progress Notes (Signed)
Patient's mother updated over the phone.  All of mother's questions answered.  Mother was able to talk with patient over the phone.

## 2018-11-29 NOTE — Progress Notes (Signed)
Patient had been starting to develop some thick thrush on tongue.  After using nystatin to clean mouth, her tongue is looking much better.  Most of thick coating has come off.

## 2018-11-29 NOTE — Progress Notes (Signed)
PROGRESS NOTE                                                                                                                                                                                                             Patient Demographics:    Tina Oliver, is a 33 y.o. female, DOB - 1986-05-24, WUJ:[id number]  Outpatient Primary MD for the patient is Abigail Miyamoto, MD    LOS - 4  Admit date - 11/25/2018    Chief Complaint  Patient presents with   Dehydration    COVID-19 +       Brief Narrative  Tina Ebbert McGeeis a 33 y.o.femalewith medical history significant ofCP, MD, total blindness. Patient is cared for at home by parents. Father tested positive for COVID x11 days ago and has been ill at home. Patient has also tested positive for COVID-19 as an outpatient per mother. Patient has had fever, but little to no respiratory symptoms. Patient has developed intractable N/V/D at home out 1 week ago.    Subsequently developed some abdominal pain and cramping and was brought to [facility] on 11/26/2018 where she saw Dr. Bosie Clos GI who cleared her from GI standpoint and suggested that her GI CT findings were secondary to COVID-19 infection.  She was then transferred to [facility] later on 11/26/2018.   Subjective:   Patient in bed, appears comfortable, denies any headache, no fever, no chest pain or pressure, no shortness of breath , no abdominal pain. No focal weakness.   Assessment  & Plan :     1.  Acute Covid 19 Viral Illness during the ongoing 2020 Covid 19 Pandemic with primarily GI symptoms and CT evidence of colitis which is slightly unusual - No pulmonary symptoms or involvement, inflammatory markers are stable except CRP is moderately elevated and pro calcitonin is above 90.  Question if she has superimposed bacterial GI infection or inflammatory bowel.  Her case was discussed by me with GI physician Dr.  Bosie Clos on 11/28/2018.  C. difficile PCR and GI pathogen panel are negative, currently on combination of Rocephin and Flagyl which will be continued.  Clinically has improved.  If better discharge in 1 to 2 days with outpatient GI follow-up.  COVID-19 Labs  Recent Labs    11/27/18 0205 11/28/18 0454 11/29/18 0534 11/29/18 0539  DDIMER  1.01* 1.43*  --  0.99*  FERRITIN 317* 337* 280  --   CRP 19.6* 10.9* 6.5*  --     Lab Results  Component Value Date   SARSCOV2NAA POSITIVE (A) 11/26/2018    2.  Dehydration and hypernatremia  - due to #1 above, improved with hydration, continue gentle IV fluids.  3.  Cerebral palsy, legally blind.  Supportive care.  Sitter if needed.  4.  Gastroenteritis.  Plan as a #1 above.  C. difficile PCR is negative, GI pathogen panel pending.  5.  Hypokalemia.  Replaced and stable     Condition - Extremely Guarded  Family Communication  : Other updated on 11/27/2018 and 11/29/2018  Code Status :  Full  Diet : Liquid  Disposition Plan  : Non-telemetry bed  Consults  :  GI  Procedures  :    CT Abd -  1. Mild hyperenhancement of the distal descending colon, sigmoid, and rectum, with a somewhat featureless appearance (series 2, image 62, series 7, image 108). This appearance is consistent with nonspecific infectious or inflammatory proctocolitis, and particularly inflammatory bowel disease such as ulcerative colitis is a consideration with this appearance. 2. Extensive, irregular ground-glass and heterogeneous opacity of the included bilateral lung bases, consistent with infection or aspiration.  PUD Prophylaxis :   DVT Prophylaxis  :  Lovenox   Lab Results  Component Value Date   PLT 266 11/29/2018    Inpatient Medications  Scheduled Meds:  enoxaparin (LOVENOX) injection  40 mg Subcutaneous Daily   mouth rinse  15 mL Mouth Rinse BID   nystatin  5 mL Oral QID   Continuous Infusions:  cefTRIAXone (ROCEPHIN)  IV 2 g (11/28/18 1337)    fluconazole (DIFLUCAN) IV     lactated ringers with kcl 75 mL/hr at 11/28/18 1001   metronidazole 500 mg (11/29/18 0643)   PRN Meds:.acetaminophen, chlorpheniramine-HYDROcodone, guaiFENesin-dextromethorphan, morphine injection, [DISCONTINUED] ondansetron **OR** ondansetron (ZOFRAN) IV  Antibiotics  :    Anti-infectives (From admission, onward)   Start     Dose/Rate Route Frequency Ordered Stop   11/29/18 1100  fluconazole (DIFLUCAN) IVPB 100 mg     100 mg 50 mL/hr over 60 Minutes Intravenous  Once 11/29/18 1020     11/28/18 2200  metroNIDAZOLE (FLAGYL) IVPB 500 mg     500 mg 100 mL/hr over 60 Minutes Intravenous Every 8 hours 11/28/18 1355     11/28/18 1400  cefTRIAXone (ROCEPHIN) 2 g in sodium chloride 0.9 % 100 mL IVPB     2 g 200 mL/hr over 30 Minutes Intravenous Every 24 hours 11/28/18 1203     11/28/18 1400  metroNIDAZOLE (FLAGYL) tablet 500 mg  Status:  Discontinued     500 mg Oral Every 8 hours 11/28/18 1203 11/28/18 1355   11/26/18 1600  meropenem (MERREM) 1 g in sodium chloride 0.9 % 100 mL IVPB  Status:  Discontinued     1 g 200 mL/hr over 30 Minutes Intravenous Every 8 hours 11/26/18 1552 11/28/18 1203   11/26/18 0900  meropenem (MERREM) 1 g in sodium chloride 0.9 % 100 mL IVPB  Status:  Discontinued     1 g 200 mL/hr over 30 Minutes Intravenous Every 8 hours 11/26/18 0821 11/26/18 1539   11/25/18 2230  ciprofloxacin (CIPRO) IVPB 400 mg  Status:  Discontinued     400 mg 200 mL/hr over 60 Minutes Intravenous  Once 11/25/18 2226 11/25/18 2301   11/25/18 2230  metroNIDAZOLE (FLAGYL) IVPB 500 mg  Status:  Discontinued     500 mg 100 mL/hr over 60 Minutes Intravenous  Once 11/25/18 2226 11/25/18 2301       Time Spent in minutes  30   Susa Raring M.D on 11/29/2018 at 10:23 AM  To page go to [email or web address] - password [facility]  [facility] -  Office  [phone]  See all Orders from today for further details    Objective:   Vitals:   11/28/18 1720 11/28/18  2000 11/29/18 0300 11/29/18 0743  BP: 105/76  104/69   Pulse:      Resp:      Temp:  98.6 F (37 C) 98.1 F (36.7 C) 99 F (37.2 C)  TempSrc:  Oral Oral Oral  SpO2:      Weight:      Height:        Wt Readings from Last 3 Encounters:  11/26/18 68.1 kg     Intake/Output Summary (Last 24 hours) at 11/29/2018 1023 Last data filed at 11/29/2018 0981 Gross per 24 hour  Intake 1033.27 ml  Output 1450 ml  Net -416.73 ml     Physical Exam  Awake Alert, legally blind, in no distress [place].AT,PERRAL Supple Neck,No JVD, No cervical lymphadenopathy appriciated.  Symmetrical Chest wall movement, Good air movement bilaterally, CTAB RRR,No Gallops, Rubs or new Murmurs, No Parasternal Heave +ve B.Sounds, Abd Soft, No tenderness, No organomegaly appriciated, No rebound - guarding or rigidity. No Cyanosis, Clubbing or edema, No new Rash or bruise    Data Review:    CBC Recent Labs  Lab 11/25/18 1753 11/26/18 0624 11/27/18 0205 11/28/18 0454 11/29/18 0539  WBC 6.1 7.1 6.0 4.2 3.1*  HGB 13.9 13.9 11.7* 11.9* 11.2*  HCT 42.7 45.0 38.5 37.8 34.9*  PLT 174 180 189 221 266  MCV 84.9 86.4 87.7 84.8 84.9  MCH 27.6 26.7 26.7 26.7 27.3  MCHC 32.6 30.9 30.4 31.5 32.1  RDW 14.4 14.7 15.3 15.2 15.5  LYMPHSABS 1.1 1.7 2.0 2.3 1.4  MONOABS 0.2 0.2 0.2 0.2 0.3  EOSABS 0.0 0.0 0.0 0.0 0.0  BASOSABS 0.0 0.0 0.0 0.0 0.0    Chemistries  Recent Labs  Lab 11/25/18 1753 11/26/18 0624 11/27/18 0205 11/28/18 0454 11/29/18 0539  NA 137 141 146* 139 143  K 3.0* 4.0 4.0 3.2* 4.3  CL 100 111 113* 105 109  CO2 23 20* GLUCOSE 76 97 104* 116* 81  BUN 7 <5* <5* <5* <5*  CREATININE 0.68 0.60 0.62 0.49 0.55  CALCIUM 8.1* 7.9* 8.0* 8.0* 8.3*  MG  --   --  2.0 2.0 1.9  AST 71* 60* 34 28 20  ALT 44 39 ALKPHOS 80 70 52 50 46  BILITOT 0.7 0.3 0.4 0.3 0.3   ------------------------------------------------------------------------------------------------------------------ No  results for input(s): CHOL, HDL, LDLCALC, TRIG, CHOLHDL, LDLDIRECT in the last 72 hours.  No results found for: HGBA1C ------------------------------------------------------------------------------------------------------------------ No results for input(s): TSH, T4TOTAL, T3FREE, THYROIDAB in the last 72 hours.  Invalid input(s): FREET3  Cardiac Enzymes No results for input(s): CKMB, TROPONINI, MYOGLOBIN in the last 168 hours.  Invalid input(s): CK ------------------------------------------------------------------------------------------------------------------ No results found for: BNP  Micro Results Recent Results (from the past 240 hour(s))  Blood culture (routine x 2)     Status: None (Preliminary result)   Collection Time: 11/25/18  5:58 PM  Result Value Ref Range Status   Specimen Description BLOOD RIGHT ARM  Final   Special Requests   Final  BOTTLES DRAWN AEROBIC AND ANAEROBIC Blood Culture results may not be optimal due to an excessive volume of blood received in culture bottles   Culture   Final    NO GROWTH 4 DAYS Performed at [facility], [address] [place], [place] [address]    Report Status PENDING  Incomplete  SARS Coronavirus 2 (CEPHEID- Performed in [facility] hospital lab), Hosp Order     Status: Abnormal   Collection Time: 11/26/18  1:54 AM  Result Value Ref Range Status   SARS Coronavirus 2 POSITIVE (A) NEGATIVE Final    Comment: CRITICAL RESULT CALLED TO, READ BACK BY AND VERIFIED WITH: CAMPBELL,M @ 0344 ON [id number] BY POTEAT,S (NOTE) If result is NEGATIVE SARS-CoV-2 target nucleic acids are NOT DETECTED. The SARS-CoV-2 RNA is generally detectable in upper and lower  respiratory specimens during the acute phase of infection. The lowest  concentration of SARS-CoV-2 viral copies this assay can detect is 250  copies / mL. A negative result does not preclude SARS-CoV-2 infection  and should not be used as the sole basis for treatment or other    patient management decisions.  A negative result may occur with  improper specimen collection / handling, submission of specimen other  than nasopharyngeal swab, presence of viral mutation(s) within the  areas targeted by this assay, and inadequate number of viral copies  (<250 copies / mL). A negative result must be combined with clinical  observations, patient history, and epidemiological information. If result is POSITIVE SARS-CoV-2 target nucleic acids are  DETECTED. The SARS-CoV-2 RNA is generally detectable in upper and lower  respiratory specimens during the acute phase of infection.  Positive  results are indicative of active infection with SARS-CoV-2.  Clinical  correlation with patient history and other diagnostic information is  necessary to determine patient infection status.  Positive results do  not rule out bacterial infection or co-infection with other viruses. If result is PRESUMPTIVE POSTIVE SARS-CoV-2 nucleic acids MAY BE PRESENT.   A presumptive positive result was obtained on the submitted specimen  and confirmed on repeat testing.  While 2019 novel coronavirus  (SARS-CoV-2) nucleic acids may be present in the submitted sample  additional confirmatory testing may be necessary for epidemiological  and / or clinical management purposes  to differentiate between  SARS-CoV-2 and other Sarbecovirus currently known to infect humans.  If clinically indicated additional testing with an alternate test  methodology  [phone]) is advised. The SARS-CoV-2 RNA is generally  detectable in upper and lower respiratory specimens during the acute  phase of infection. The expected result is Negative. Fact Sheet for Patients:  BoilerBrush.com.cy Fact Sheet for Healthcare Providers: https://pope.com/ This test is not yet approved or cleared by the [place] FDA and has been authorized for detection and/or diagnosis of SARS-CoV-2  by FDA under an Emergency Use Authorization (EUA).  This EUA will remain in effect (meaning this test can be used) for the duration of the COVID-19 declaration under Section 564(b)(1) of the Act, 21 U.S.C. section 360bbb-3(b)(1), unless the authorization is terminated or revoked sooner. Performed at [facility], [address] [place], [place] [address]   Blood culture (routine x 2)     Status: None (Preliminary result)   Collection Time: 11/26/18  6:24 AM  Result Value Ref Range Status   Specimen Description   Final    BLOOD RIGHT HAND Performed at [facility], [address] [place], [place] [address]  Special Requests   Final    BOTTLES DRAWN AEROBIC ONLY Blood Culture adequate volume Performed at [facility], [address] [place], [place] [address]    Culture   Final    NO GROWTH 3 DAYS Performed at [facility], [address], [place], [place] [address]    Report Status PENDING  Incomplete  C difficile quick scan w PCR reflex     Status: None   Collection Time: 11/27/18 10:45 AM  Result Value Ref Range Status   C Diff antigen NEGATIVE NEGATIVE Final   C Diff toxin NEGATIVE NEGATIVE Final   C Diff interpretation No C. difficile detected.  Final    Comment: Performed at [facility], [address], [place], [place] [address]  Gastrointestinal Panel by PCR , Stool     Status: None   Collection Time: 11/28/18  1:28 PM  Result Value Ref Range Status   Campylobacter species NOT DETECTED NOT DETECTED Final   Plesimonas shigelloides NOT DETECTED NOT DETECTED Final   Salmonella species NOT DETECTED NOT DETECTED Final   Yersinia enterocolitica NOT DETECTED NOT DETECTED Final   Vibrio species NOT DETECTED NOT DETECTED Final   Vibrio cholerae NOT DETECTED NOT DETECTED Final   Enteroaggregative E coli (EAEC) NOT DETECTED NOT DETECTED Final   Enteropathogenic E coli (EPEC) NOT DETECTED NOT  DETECTED Final   Enterotoxigenic E coli (ETEC) NOT DETECTED NOT DETECTED Final   Shiga like toxin producing E coli (STEC) NOT DETECTED NOT DETECTED Final   Shigella/Enteroinvasive E coli (EIEC) NOT DETECTED NOT DETECTED Final   Cryptosporidium NOT DETECTED NOT DETECTED Final   Cyclospora cayetanensis NOT DETECTED NOT DETECTED Final   Entamoeba histolytica NOT DETECTED NOT DETECTED Final   Giardia lamblia NOT DETECTED NOT DETECTED Final   Adenovirus F40/41 NOT DETECTED NOT DETECTED Final   Astrovirus NOT DETECTED NOT DETECTED Final   Norovirus GI/GII NOT DETECTED NOT DETECTED Final   Rotavirus A NOT DETECTED NOT DETECTED Final   Sapovirus (I, II, IV, and V) NOT DETECTED NOT DETECTED Final    Comment: Performed at [facility], [address] [place], [place] [address]    Radiology Reports Ct Abdomen Pelvis W Contrast  Result Date: 11/25/2018 CLINICAL DATA:  Abdominal pain EXAM: CT ABDOMEN AND PELVIS WITH CONTRAST TECHNIQUE: Multidetector CT imaging of the abdomen and pelvis was performed using the standard protocol following bolus administration of intravenous contrast. CONTRAST:  OMNIPAQUE IOHEXOL 300 MG/ML  SOLN COMPARISON:  None. FINDINGS: Lower chest: Extensive, irregular ground-glass and heterogeneous opacity of the included bilateral lung bases. Hepatobiliary: No focal liver abnormality is seen. No gallstones, gallbladder wall thickening, or biliary dilatation. Pancreas: Unremarkable. No pancreatic ductal dilatation or surrounding inflammatory changes. Spleen: Normal in size without focal abnormality. Adrenals/Urinary Tract: Adrenal glands are unremarkable. Kidneys are normal, without renal calculi, focal lesion, or hydronephrosis. Bladder is unremarkable. Stomach/Bowel: Stomach is within normal limits. Appendix appears normal. Mild hyperenhancement of the distal descending colon, sigmoid, and rectum, with a somewhat featureless appearance (series 2, image 62, series 7, image  108). Vascular/Lymphatic: No significant vascular findings are present. No enlarged abdominal or pelvic lymph nodes. Reproductive: No mass or other abnormality. Other: No abdominal wall hernia or abnormality. No abdominopelvic ascites. Musculoskeletal: No acute or significant osseous findings. IMPRESSION: 1. Mild hyperenhancement of the distal descending colon, sigmoid, and rectum, with a somewhat featureless appearance (series 2, image 62, series 7, image 108). This appearance is consistent with nonspecific  infectious or inflammatory proctocolitis, and particularly inflammatory bowel disease such as ulcerative colitis is a consideration with this appearance. 2. Extensive, irregular ground-glass and heterogeneous opacity of the included bilateral lung bases, consistent with infection or aspiration. Electronically Signed   By: Lauralyn PrimesAlex  Bibbey M.D.   On: 11/25/2018 22:01   Dg Chest Port 1 View  Result Date: 11/28/2018 CLINICAL DATA:  Pain EXAM: PORTABLE CHEST 1 VIEW COMPARISON:  11/25/2018 FINDINGS: Patchy and hazy airspace disease in both central and basilar lungs. Normal heart size. No pneumothorax. No pleural effusion. IMPRESSION: Bilateral hazy and patchy airspace opacities. Followup PA and lateral chest X-ray is recommended in 3-4 weeks following trial of antibiotic therapy to ensure resolution and exclude underlying malignancy. Electronically Signed   By: Jolaine ClickArthur  Hoss M.D.   On: 11/28/2018 13:29   Dg Chest Port 1 View  Result Date: 11/25/2018 CLINICAL DATA:  Vomiting, diarrhea, history of cerebral palsy EXAM: PORTABLE CHEST 1 VIEW COMPARISON:  None. FINDINGS: Cardiomegaly. Left basilar heterogeneous opacity and probable small layering pleural effusion. IMPRESSION: Cardiomegaly. Left basilar heterogeneous opacity and probable small layering pleural effusion, concerning for infection or aspiration. Electronically Signed   By: Lauralyn PrimesAlex  Bibbey M.D.   On: 11/25/2018 19:39   Dg Abd Portable 1v  Result Date:  11/29/2018 CLINICAL DATA:  Abdominal pain EXAM: PORTABLE ABDOMEN - 1 VIEW COMPARISON:  Four days ago FINDINGS: Normal bowel gas pattern. Scoliosis. There is history of cerebral palsy. No concerning mass effect or gas collection. IMPRESSION: Normal bowel gas pattern. Electronically Signed   By: Marnee SpringJonathon  Watts M.D.   On: 11/29/2018 09:35

## 2018-11-30 LAB — CBC WITH DIFFERENTIAL/PLATELET
Abs Immature Granulocytes: 0.03 10*3/uL (ref 0.00–0.07)
Basophils Absolute: 0 10*3/uL (ref 0.0–0.1)
Basophils Relative: 1 %
Eosinophils Absolute: 0.1 10*3/uL (ref 0.0–0.5)
Eosinophils Relative: 2 %
HCT: 38.3 % (ref 36.0–46.0)
Hemoglobin: 12.2 g/dL (ref 12.0–15.0)
Immature Granulocytes: 1 %
Lymphocytes Relative: 35 %
Lymphs Abs: 1.2 10*3/uL (ref 0.7–4.0)
MCH: 27.2 pg (ref 26.0–34.0)
MCHC: 31.9 g/dL (ref 30.0–36.0)
MCV: 85.5 fL (ref 80.0–100.0)
Monocytes Absolute: 0.3 10*3/uL (ref 0.1–1.0)
Monocytes Relative: 10 %
Neutro Abs: 1.8 10*3/uL (ref 1.7–7.7)
Neutrophils Relative %: 51 %
Platelets: 307 10*3/uL (ref 150–400)
RBC: 4.48 MIL/uL (ref 3.87–5.11)
RDW: 15.3 % (ref 11.5–15.5)
WBC: 3.4 10*3/uL — ABNORMAL LOW (ref 4.0–10.5)
nRBC: 0 % (ref 0.0–0.2)

## 2018-11-30 LAB — COMPREHENSIVE METABOLIC PANEL
ALT: 16 U/L (ref 0–44)
AST: 26 U/L (ref 15–41)
Albumin: 2.4 g/dL — ABNORMAL LOW (ref 3.5–5.0)
Alkaline Phosphatase: 55 U/L (ref 38–126)
Anion gap: 9 (ref 5–15)
BUN: <5 mg/dL — ABNORMAL LOW (ref 6–20)
CO2: 26 mmol/L (ref 22–32)
Calcium: 8.6 mg/dL — ABNORMAL LOW (ref 8.9–10.3)
Chloride: 104 mmol/L (ref 98–111)
Creatinine, Ser: 0.52 mg/dL (ref 0.44–1.00)
GFR calc Af Amer: >60 mL/min (ref >60)
GFR calc non Af Amer: >60 mL/min (ref >60)
Glucose, Bld: 78 mg/dL (ref 70–99)
Potassium: 4.8 mmol/L (ref 3.5–5.1)
Sodium: 139 mmol/L (ref 135–145)
Total Bilirubin: 0.3 mg/dL (ref 0.3–1.2)
Total Protein: 6.3 g/dL — ABNORMAL LOW (ref 6.5–8.1)

## 2018-11-30 LAB — CULTURE, BLOOD (ROUTINE X 2): Culture: NO GROWTH

## 2018-11-30 LAB — PROCALCITONIN: Procalcitonin: 2.4 ng/mL

## 2018-11-30 LAB — MAGNESIUM: Magnesium: 1.9 mg/dL (ref 1.7–2.4)

## 2018-11-30 LAB — FERRITIN: Ferritin: 268 ng/mL (ref 11–307)

## 2018-11-30 LAB — D-DIMER, QUANTITATIVE: D-Dimer, Quant: 0.83 ug/mL-FEU — ABNORMAL HIGH (ref 0.00–0.50)

## 2018-11-30 LAB — C-REACTIVE PROTEIN: CRP: 6.7 mg/dL — ABNORMAL HIGH (ref <1.0)

## 2018-11-30 NOTE — Progress Notes (Signed)
PROGRESS NOTE                                                                                                                                                                                                             Patient Demographics:    Tina Oliver, is a 33 y.o. female, DOB - 1985/10/05, ZOX:[id number]  Outpatient Primary MD for the patient is Abigail Miyamoto, MD    LOS - 5  Admit date - 11/25/2018    Chief Complaint  Patient presents with   Dehydration    COVID-19 +       Brief Narrative  Tina Marsicano McGeeis a 33 y.o.femalewith medical history significant ofCP, MD, total blindness. Patient is cared for at home by parents. Father tested positive for COVID x11 days ago and has been ill at home. Patient has also tested positive for COVID-19 as an outpatient per mother. Patient has had fever, but little to no respiratory symptoms. Patient has developed intractable N/V/D at home out 1 week ago.    Subsequently developed some abdominal pain and cramping and was brought to [facility] on 11/26/2018 where she saw Dr. Bosie Clos GI who cleared her from GI standpoint and suggested that her GI CT findings were secondary to COVID-19 infection.  She was then transferred to [facility] later on 11/26/2018.   Subjective:   Patient in bed, appears comfortable, denies any headache, no fever, no chest pain or pressure, no shortness of breath , no abdominal pain and no diarrhea. No focal weakness.   Assessment  & Plan :     1.  Acute Covid 19 Viral Illness during the ongoing 2020 Covid 19 Pandemic with primarily GI symptoms and CT evidence of colitis which is slightly unusual -   No pulmonary symptoms or involvement, inflammatory markers are stable except CRP is moderately elevated and pro calcitonin is above 90.  Question if she has superimposed bacterial GI infection or inflammatory bowel.  Her case was discussed by me  with GI physician Dr. Bosie Clos on 11/28/2018.    C. difficile PCR and GI pathogen panel are both negative, she has improved considerably on IV Rocephin and Flagyl combination which I will continue for another day thereafter switch to oral cephalosporin and Flagyl combination for 3 to 4 days and discharged home with outpatient equal GI follow-up.  She will eventually require outpatient colonoscopy.  Her inflammatory markers have stabilized including procalcitonin which is coming down remarkably with a high level of 90 down to 2.5.  Blood cultures negative thus far.  COVID-19 Labs  Recent Labs    11/28/18 0454 11/29/18 0534 11/29/18 0539 11/30/18 0402  DDIMER 1.43*  --  0.99* 0.83*  FERRITIN 337* 280  --  268  CRP 10.9* 6.5*  --  6.7*    Lab Results  Component Value Date   SARSCOV2NAA POSITIVE (A) 11/26/2018    2.  Dehydration and hypernatremia  - due to #1 above, improved with hydration, continue gentle IV fluids.  3.  Cerebral palsy, legally blind.  Supportive care.  Sitter if needed.  4.  Gastroenteritis.  Plan as a #1 above.  C. difficile PCR is negative, GI pathogen panel pending.  5.  Hypokalemia.  Replaced and stable.  6.  Oral thrush.  Improving on nystatin swish and swallow.  Also received 1 dose of IV  Diflucan.    Condition - Extremely Guarded  Family Communication  : [name] mother updated on 11/27/2018 and 11/29/2018, likely discharge home on 12/01/2018 or 12/02/2018.  Code Status :  Full  Diet : Liquid  Disposition Plan  : Discharge home with RN and PT in the next 1 to 2 days.  Mother prefers taking her home.  Consults  :  GI  Procedures  :    CT Abd -  1. Mild hyperenhancement of the distal descending colon, sigmoid, and rectum, with a somewhat featureless appearance (series 2, image 62, series 7, image 108). This appearance is consistent with nonspecific infectious or inflammatory proctocolitis, and particularly inflammatory bowel disease such as ulcerative  colitis is a consideration with this appearance. 2. Extensive, irregular ground-glass and heterogeneous opacity of the included bilateral lung bases, consistent with infection or aspiration.  PUD Prophylaxis :   DVT Prophylaxis  :  Lovenox   Lab Results  Component Value Date   PLT 307 11/30/2018    Inpatient Medications  Scheduled Meds:  enoxaparin (LOVENOX) injection  40 mg Subcutaneous Daily   mouth rinse  15 mL Mouth Rinse BID   nystatin  5 mL Oral QID   Continuous Infusions:  cefTRIAXone (ROCEPHIN)  IV 2 g (11/29/18 1335)   metronidazole 500 mg (11/30/18 0621)   PRN Meds:.acetaminophen, chlorpheniramine-HYDROcodone, guaiFENesin-dextromethorphan, morphine injection, [DISCONTINUED] ondansetron **OR** ondansetron (ZOFRAN) IV  Antibiotics  :    Anti-infectives (From admission, onward)   Start     Dose/Rate Route Frequency Ordered Stop   11/29/18 1100  fluconazole (DIFLUCAN) IVPB 100 mg     100 mg 50 mL/hr over 60 Minutes Intravenous  Once 11/29/18 1020 11/29/18 1300   11/28/18 2200  metroNIDAZOLE (FLAGYL) IVPB 500 mg     500 mg 100 mL/hr over 60 Minutes Intravenous Every 8 hours 11/28/18 1355     11/28/18 1400  cefTRIAXone (ROCEPHIN) 2 g in sodium chloride 0.9 % 100 mL IVPB     2 g 200 mL/hr over 30 Minutes Intravenous Every 24 hours 11/28/18 1203     11/28/18 1400  metroNIDAZOLE (FLAGYL) tablet 500 mg  Status:  Discontinued     500 mg Oral Every 8 hours 11/28/18 1203 11/28/18 1355   11/26/18 1600  meropenem (MERREM) 1 g in sodium chloride 0.9 % 100 mL IVPB  Status:  Discontinued     1 g 200 mL/hr over 30 Minutes Intravenous Every 8 hours 11/26/18 1552 11/28/18 1203   11/26/18  0900  meropenem (MERREM) 1 g in sodium chloride 0.9 % 100 mL IVPB  Status:  Discontinued     1 g 200 mL/hr over 30 Minutes Intravenous Every 8 hours 11/26/18 0821 11/26/18 1539   11/25/18 2230  ciprofloxacin (CIPRO) IVPB 400 mg  Status:  Discontinued     400 mg 200 mL/hr over 60 Minutes  Intravenous  Once 11/25/18 2226 11/25/18 2301   11/25/18 2230  metroNIDAZOLE (FLAGYL) IVPB 500 mg  Status:  Discontinued     500 mg 100 mL/hr over 60 Minutes Intravenous  Once 11/25/18 2226 11/25/18 2301       Time Spent in minutes  30   Susa Raring M.D on 11/30/2018 at 11:14 AM  To page go to [email or web address] - password [facility]  [facility] -  Office  [phone]  See all Orders from today for further details    Objective:   Vitals:   11/29/18 2000 11/30/18 0011 11/30/18 0415 11/30/18 0814  BP:  (!) 135/91 127/86 115/81  Pulse: 71 73 82 79  Resp:  Temp:  98.5 F (36.9 C) 98 F (36.7 C) 97.7 F (36.5 C)  TempSrc:  Oral Oral Oral  SpO2: 95% 98% 93% 92%  Weight:      Height:        Wt Readings from Last 3 Encounters:  11/26/18 68.1 kg     Intake/Output Summary (Last 24 hours) at 11/30/2018 1114 Last data filed at 11/30/2018 0900 Gross per 24 hour  Intake 1036.73 ml  Output --  Net 1036.73 ml     Physical Exam  Awake Alert, legally blind, mild oral thrush, [place].AT,PERRAL Supple Neck,No JVD, No cervical lymphadenopathy appriciated.  Symmetrical Chest wall movement, Good air movement bilaterally, CTAB RRR,No Gallops, Rubs or new Murmurs, No Parasternal Heave +ve B.Sounds, Abd Soft, No tenderness, No organomegaly appriciated, No rebound - guarding or rigidity. No Cyanosis, Clubbing or edema, No new Rash or bruise     Data Review:    CBC Recent Labs  Lab 11/26/18 0624 11/27/18 0205 11/28/18 0454 11/29/18 0539 11/30/18 0402  WBC 7.1 6.0 4.2 3.1* 3.4*  HGB 13.9 11.7* 11.9* 11.2* 12.2  HCT 45.0 38.5 37.8 34.9* 38.3  PLT 180 189 221 266 307  MCV 86.4 87.7 84.8 84.9 85.5  MCH 26.7 26.7 26.7 27.3 27.2  MCHC 30.9 30.4 31.5 32.1 31.9  RDW 14.7 15.3 15.2 15.5 15.3  LYMPHSABS 1.7 2.0 2.3 1.4 1.2  MONOABS 0.2 0.2 0.2 0.3 0.3  EOSABS 0.0 0.0 0.0 0.0 0.1  BASOSABS 0.0 0.0 0.0 0.0 0.0    Chemistries  Recent Labs  Lab 11/26/18 0624  11/27/18 0205 11/28/18 0454 11/29/18 0539 11/30/18 0402  NA 141 146* 139 143 139  K 4.0 4.0 3.2* 4.3 4.8  CL 111 113* 105 109 104  CO2 20* GLUCOSE 97 104* 116* 81 78  BUN <5* <5* <5* <5* <5*  CREATININE 0.60 0.62 0.49 0.55 0.52  CALCIUM 7.9* 8.0* 8.0* 8.3* 8.6*  MG  --  2.0 2.0 1.9 1.9  AST 60* 34 ALT 39 ALKPHOS 70 52 50 46 55  BILITOT 0.3 0.4 0.3 0.3 0.3   ------------------------------------------------------------------------------------------------------------------ No results for input(s): CHOL, HDL, LDLCALC, TRIG, CHOLHDL, LDLDIRECT in the last 72 hours.  No results found for: HGBA1C ------------------------------------------------------------------------------------------------------------------ No results for input(s): TSH, T4TOTAL, T3FREE, THYROIDAB in the last 72 hours.  Invalid input(s): FREET3  Cardiac Enzymes No results for input(s): CKMB, TROPONINI, MYOGLOBIN in the last 168 hours.  Invalid input(s): CK ------------------------------------------------------------------------------------------------------------------ No results found for: BNP  Micro Results Recent Results (from the past 240 hour(s))  Blood culture (routine x 2)     Status: None (Preliminary result)   Collection Time: 11/25/18  5:58 PM  Result Value Ref Range Status   Specimen Description BLOOD RIGHT ARM  Final   Special Requests   Final    BOTTLES DRAWN AEROBIC AND ANAEROBIC Blood Culture results may not be optimal due to an excessive volume of blood received in culture bottles   Culture   Final    NO GROWTH 4 DAYS Performed at [facility], [address] [place], [place] [address]    Report Status PENDING  Incomplete  SARS Coronavirus 2 (CEPHEID- Performed in [facility] hospital lab), Hosp Order     Status: Abnormal   Collection Time: 11/26/18  1:54 AM  Result Value Ref Range Status   SARS Coronavirus 2 POSITIVE (A) NEGATIVE Final     Comment: CRITICAL RESULT CALLED TO, READ BACK BY AND VERIFIED WITH: CAMPBELL,M @ 0344 ON [id number] BY POTEAT,S (NOTE) If result is NEGATIVE SARS-CoV-2 target nucleic acids are NOT DETECTED. The SARS-CoV-2 RNA is generally detectable in upper and lower  respiratory specimens during the acute phase of infection. The lowest  concentration of SARS-CoV-2 viral copies this assay can detect is 250  copies / mL. A negative result does not preclude SARS-CoV-2 infection  and should not be used as the sole basis for treatment or other  patient management decisions.  A negative result may occur with  improper specimen collection / handling, submission of specimen other  than nasopharyngeal swab, presence of viral mutation(s) within the  areas targeted by this assay, and inadequate number of viral copies  (<250 copies / mL). A negative result must be combined with clinical  observations, patient history, and epidemiological information. If result is POSITIVE SARS-CoV-2 target nucleic acids are  DETECTED. The SARS-CoV-2 RNA is generally detectable in upper and lower  respiratory specimens during the acute phase of infection.  Positive  results are indicative of active infection with SARS-CoV-2.  Clinical  correlation with patient history and other diagnostic information is  necessary to determine patient infection status.  Positive results do  not rule out bacterial infection or co-infection with other viruses. If result is PRESUMPTIVE POSTIVE SARS-CoV-2 nucleic acids MAY BE PRESENT.   A presumptive positive result was obtained on the submitted specimen  and confirmed on repeat testing.  While 2019 novel coronavirus  (SARS-CoV-2) nucleic acids may be present in the submitted sample  additional confirmatory testing may be necessary for epidemiological  and / or clinical management purposes  to differentiate between  SARS-CoV-2 and other Sarbecovirus currently known to infect humans.  If clinically  indicated additional testing with an alternate test  methodology  [phone]) is advised. The SARS-CoV-2 RNA is generally  detectable in upper and lower respiratory specimens during the acute  phase of infection. The expected result is Negative. Fact Sheet for Patients:  BoilerBrush.com.cy Fact Sheet for Healthcare Providers: https://pope.com/ This test is not yet approved or cleared by the [place] FDA and has been authorized for detection and/or diagnosis of SARS-CoV-2 by FDA under an Emergency Use Authorization (EUA).  This EUA will remain in effect (meaning this test can be used) for the duration of the COVID-19 declaration under Section 564(b)(1) of the Act, 21 U.S.C. section  360bbb-3(b)(1), unless the authorization is terminated or revoked sooner. Performed at [facility], [address] [place], [place] [address]   Blood culture (routine x 2)     Status: None (Preliminary result)   Collection Time: 11/26/18  6:24 AM  Result Value Ref Range Status   Specimen Description   Final    BLOOD RIGHT HAND Performed at [facility], [address] [place], [place] [address]    Special Requests   Final    BOTTLES DRAWN AEROBIC ONLY Blood Culture adequate volume Performed at [facility], [address] [place], [place] [address]    Culture   Final    NO GROWTH 3 DAYS Performed at [facility], [address] [place], [place] [address]    Report Status PENDING  Incomplete  C difficile quick scan w PCR reflex     Status: None   Collection Time: 11/27/18 10:45 AM  Result Value Ref Range Status   C Diff antigen NEGATIVE NEGATIVE Final   C Diff toxin NEGATIVE NEGATIVE Final   C Diff interpretation No C. difficile detected.  Final    Comment: Performed at [facility], [address] [place], [place] [address]  Gastrointestinal Panel by PCR , Stool      Status: None   Collection Time: 11/28/18  1:28 PM  Result Value Ref Range Status   Campylobacter species NOT DETECTED NOT DETECTED Final   Plesimonas shigelloides NOT DETECTED NOT DETECTED Final   Salmonella species NOT DETECTED NOT DETECTED Final   Yersinia enterocolitica NOT DETECTED NOT DETECTED Final   Vibrio species NOT DETECTED NOT DETECTED Final   Vibrio cholerae NOT DETECTED NOT DETECTED Final   Enteroaggregative E coli (EAEC) NOT DETECTED NOT DETECTED Final   Enteropathogenic E coli (EPEC) NOT DETECTED NOT DETECTED Final   Enterotoxigenic E coli (ETEC) NOT DETECTED NOT DETECTED Final   Shiga like toxin producing E coli (STEC) NOT DETECTED NOT DETECTED Final   Shigella/Enteroinvasive E coli (EIEC) NOT DETECTED NOT DETECTED Final   Cryptosporidium NOT DETECTED NOT DETECTED Final   Cyclospora cayetanensis NOT DETECTED NOT DETECTED Final   Entamoeba histolytica NOT DETECTED NOT DETECTED Final   Giardia lamblia NOT DETECTED NOT DETECTED Final   Adenovirus F40/41 NOT DETECTED NOT DETECTED Final   Astrovirus NOT DETECTED NOT DETECTED Final   Norovirus GI/GII NOT DETECTED NOT DETECTED Final   Rotavirus A NOT DETECTED NOT DETECTED Final   Sapovirus (I, II, IV, and V) NOT DETECTED NOT DETECTED Final    Comment: Performed at [facility], [address] [place], [place] [address]    Radiology Reports Ct Abdomen Pelvis W Contrast  Result Date: 11/25/2018 CLINICAL DATA:  Abdominal pain EXAM: CT ABDOMEN AND PELVIS WITH CONTRAST TECHNIQUE: Multidetector CT imaging of the abdomen and pelvis was performed using the standard protocol following bolus administration of intravenous contrast. CONTRAST:  100mL OMNIPAQUE IOHEXOL 300 MG/ML  SOLN COMPARISON:  None. FINDINGS: Lower chest: Extensive, irregular ground-glass and heterogeneous opacity of the included bilateral lung bases. Hepatobiliary: No focal liver abnormality is seen. No gallstones, gallbladder wall thickening, or biliary  dilatation. Pancreas: Unremarkable. No pancreatic ductal dilatation or surrounding inflammatory changes. Spleen: Normal in size without focal abnormality. Adrenals/Urinary Tract: Adrenal glands are unremarkable. Kidneys are normal, without renal calculi, focal lesion, or hydronephrosis. Bladder is unremarkable. Stomach/Bowel: Stomach is within normal limits. Appendix appears normal. Mild hyperenhancement of the distal descending colon, sigmoid, and rectum, with  a somewhat featureless appearance (series 2, image 62, series 7, image 108). Vascular/Lymphatic: No significant vascular findings are present. No enlarged abdominal or pelvic lymph nodes. Reproductive: No mass or other abnormality. Other: No abdominal wall hernia or abnormality. No abdominopelvic ascites. Musculoskeletal: No acute or significant osseous findings. IMPRESSION: 1. Mild hyperenhancement of the distal descending colon, sigmoid, and rectum, with a somewhat featureless appearance (series 2, image 62, series 7, image 108). This appearance is consistent with nonspecific infectious or inflammatory proctocolitis, and particularly inflammatory bowel disease such as ulcerative colitis is a consideration with this appearance. 2. Extensive, irregular ground-glass and heterogeneous opacity of the included bilateral lung bases, consistent with infection or aspiration. Electronically Signed   By: Lauralyn Primes M.D.   On: 11/25/2018 22:01   Dg Chest Port 1 View  Result Date: 11/28/2018 CLINICAL DATA:  Pain EXAM: PORTABLE CHEST 1 VIEW COMPARISON:  11/25/2018 FINDINGS: Patchy and hazy airspace disease in both central and basilar lungs. Normal heart size. No pneumothorax. No pleural effusion. IMPRESSION: Bilateral hazy and patchy airspace opacities. Followup PA and lateral chest X-ray is recommended in 3-4 weeks following trial of antibiotic therapy to ensure resolution and exclude underlying malignancy. Electronically Signed   By: Jolaine Click M.D.   On:  11/28/2018 13:29   Dg Chest Port 1 View  Result Date: 11/25/2018 CLINICAL DATA:  Vomiting, diarrhea, history of cerebral palsy EXAM: PORTABLE CHEST 1 VIEW COMPARISON:  None. FINDINGS: Cardiomegaly. Left basilar heterogeneous opacity and probable small layering pleural effusion. IMPRESSION: Cardiomegaly. Left basilar heterogeneous opacity and probable small layering pleural effusion, concerning for infection or aspiration. Electronically Signed   By: Lauralyn Primes M.D.   On: 11/25/2018 19:39   Dg Abd Portable 1v  Result Date: 11/29/2018 CLINICAL DATA:  Abdominal pain EXAM: PORTABLE ABDOMEN - 1 VIEW COMPARISON:  Four days ago FINDINGS: Normal bowel gas pattern. Scoliosis. There is history of cerebral palsy. No concerning mass effect or gas collection. IMPRESSION: Normal bowel gas pattern. Electronically Signed   By: Marnee Spring M.D.   On: 11/29/2018 09:35

## 2018-11-30 NOTE — Progress Notes (Signed)
Spoke with patient mother who would like to hear from the doctor tomorrow regarding possible D/C.

## 2018-11-30 NOTE — Progress Notes (Signed)
Spoke with patient's mother. I told her she wasn't eating very well and she stated to try some vanilla pudding, ensure and applesauce. I told her that I would call back this evening to update her.

## 2018-12-01 LAB — CULTURE, BLOOD (ROUTINE X 2)
Culture: NO GROWTH
Special Requests: ADEQUATE

## 2018-12-01 MED ORDER — METRONIDAZOLE 500 MG PO TABS: 500.0000 mg | ORAL_TABLET | Freq: Three times a day (TID) | ORAL | 0 refills | Status: AC

## 2018-12-01 MED ORDER — NYSTATIN 100000 UNIT/ML MT SUSP: 5.0000 mL | Freq: Four times a day (QID) | OROMUCOSAL | 0 refills | Status: AC

## 2018-12-01 MED ORDER — CEFDINIR 300 MG PO CAPS: 300.0000 mg | ORAL_CAPSULE | Freq: Two times a day (BID) | ORAL | 0 refills | Status: AC

## 2018-12-01 NOTE — Care Management (Signed)
Attempted to contact the patients mother via phone to discuss the POC and assess if a thermometer/pulseox device is needed, with no answer. CM spoke to [facility] RN, [facility] AC, with a thermometer and pulse ox device to be provided to the patient prior to her transitioning home.  Colleen Can RN, BSN, NCM-BC, ACM-RN [phone]

## 2018-12-01 NOTE — Discharge Summary (Signed)
PATIENT DETAILS Name: Tina Oliver Age: 33 y.o. Sex: female Date of Birth: [birth year] MRN: [id number]. Admitting Physician: Hillary Bow, DO WUJ:[id number], Mickle Mallory, MD  Admit Date: 11/25/2018 Discharge date: 12/01/2018  Recommendations for Outpatient Follow-up:  1. Follow up with PCP in 1-2 weeks 2. Please obtain BMP/CBC in one week 3. Please ensure follow-up with [facility] gastroenterology for outpatient colonoscopy  Admitted From:  Home  Disposition: Home   Home Health: No  Equipment/Devices: None  Discharge Condition: Stable  CODE STATUS: FULL CODE  Diet recommendation:   Regular  Brief Summary: See H&P, Labs, Consult and Test reports for all details in brief, patient is a 33 year old with history of cerebral palsy, muscular dystrophy, blindness-who presented with nausea, vomiting and diarrhea.  Further evaluation revealed COVID-19 positive.  See below for further details  Brief Hospital Course: COVID-19 viral illness with predominantly GI symptoms: Managed with supportive care-prior hospitalist MDs discussed with [facility] gastroenterologist-Dr Schooler-recommendations were for supportive care.  C. difficile PCR and GI pathogen panel was negative.  Patient was started on Rocephin and Flagyl with significant clinical improvement.  By day of discharge-able to tolerate diet-no longer with nausea, vomiting or diarrhea.  Abdominal exam very benign.  Since improved-is stable to be discharged back to the care of her mother.  This MD discussed with mother over the phone.  Continue antimicrobial therapy for a few more days.  Note-on room air!  Oral thrush: Continue nystatin on discharge for a total 7-day course.  Dehydration: Secondary to gastroenteritis: Resolved with supportive care.  History of cerebral palsy/muscular dystrophy/blindness: Being discharged to the care of her mother.  Procedures/Studies: None  Discharge Diagnoses:  Principal Problem:   COVID-19  virus infection Active Problems:   Nausea vomiting and diarrhea   Colitis presumed infectious   Acute respiratory failure with hypoxia [facility])   Discharge Instructions:  Activity:  As tolerated with Full fall precautions use walker/cane & assistance as needed   Discharge Instructions    Call MD for:  persistant nausea and vomiting   Complete by:  As directed    Call MD for:  severe uncontrolled pain   Complete by:  As directed    Call MD for:  temperature >100.4   Complete by:  As directed    Diet general   Complete by:  As directed    Discharge instructions   Complete by:  As directed    Follow with Primary MD  Abigail Miyamoto, MD in 1 week  Please follow-up with [facility] gastroenterology for a outpatient elective colonoscopy  Please get a complete blood count and chemistry panel checked by your Primary MD at your next visit, and again as instructed by your Primary MD.  Get Medicines reviewed and adjusted: Please take all your medications with you for your next visit with your Primary MD  Laboratory/radiological data: Please request your Primary MD to go over all hospital tests and procedure/radiological results at the follow up, please ask your Primary MD to get all Hospital records sent to his/her office.  In some cases, they will be blood work, cultures and biopsy results pending at the time of your discharge. Please request that your primary care M.D. follows up on these results.  Also Note the following: If you experience worsening of your admission symptoms, develop shortness of breath, life threatening emergency, suicidal or homicidal thoughts you must seek medical attention immediately by calling 911 or calling your MD immediately  if symptoms less severe.  You  must read complete instructions/literature along with all the possible adverse reactions/side effects for all the Medicines you take and that have been prescribed to you. Take any new Medicines after you  have completely understood and accpet all the possible adverse reactions/side effects.   Do not drive when taking Pain medications or sleeping medications (Benzodaizepines)  Do not take more than prescribed Pain, Sleep and Anxiety Medications. It is not advisable to combine anxiety,sleep and pain medications without talking with your primary care practitioner  Special Instructions: If you have smoked or chewed Tobacco  in the last 2 yrs please stop smoking, stop any regular Alcohol  and or any Recreational drug use.  Wear Seat belts while driving.  Please note: You were cared for by a hospitalist during your hospital stay. Once you are discharged, your primary care physician will handle any further medical issues. Please note that NO REFILLS for any discharge medications will be authorized once you are discharged, as it is imperative that you return to your primary care physician (or establish a relationship with a primary care physician if you do not have one) for your post hospital discharge needs so that they can reassess your need for medications and monitor your lab values.  ?   Person Under Monitoring Name: Tina Oliver  Location: [address] [place] [address]   Infection Prevention Recommendations for Individuals Confirmed to have, or Being Evaluated for, 2019 Novel Coronavirus (COVID-19) Infection Who Receive Care at Home  Individuals who are confirmed to have, or are being evaluated for, COVID-19 should follow the prevention steps below until a healthcare provider or local or state health department says they can return to normal activities.  Stay home except to get medical care You should restrict activities outside your home, except for getting medical care. Do not go to work, school, or public areas, and do not use public transportation or taxis.  Call ahead before visiting your doctor Before your medical appointment, call the healthcare provider and tell them  that you have, or are being evaluated for, COVID-19 infection. This will help the healthcare provider's office take steps to keep other people from getting infected. Ask your healthcare provider to call the local or state health department.  Monitor your symptoms Seek prompt medical attention if your illness is worsening (e.g., difficulty breathing). Before going to your medical appointment, call the healthcare provider and tell them that you have, or are being evaluated for, COVID-19 infection. Ask your healthcare provider to call the local or state health department.  Wear a facemask You should wear a facemask that covers your nose and mouth when you are in the same room with other people and when you visit a healthcare provider. People who live with or visit you should also wear a facemask while they are in the same room with you.  Separate yourself from other people in your home As much as possible, you should stay in a different room from other people in your home. Also, you should use a separate bathroom, if available.  Avoid sharing household items You should not share dishes, drinking glasses, cups, eating utensils, towels, bedding, or other items with other people in your home. After using these items, you should wash them thoroughly with soap and water.  Cover your coughs and sneezes Cover your mouth and nose with a tissue when you cough or sneeze, or you can cough or sneeze into your sleeve. Throw used tissues in a lined trash can, and  immediately wash your hands with soap and water for at least 20 seconds or use an alcohol-based hand rub.  Wash your [employer] your hands often and thoroughly with soap and water for at least 20 seconds. You can use an alcohol-based hand sanitizer if soap and water are not available and if your hands are not visibly dirty. Avoid touching your eyes, nose, and mouth with unwashed hands.   Prevention Steps for Caregivers and Household Members  of Individuals Confirmed to have, or Being Evaluated for, COVID-19 Infection Being Cared for in the Home  If you live with, or provide care at home for, a person confirmed to have, or being evaluated for, COVID-19 infection please follow these guidelines to prevent infection:  Follow healthcare provider's instructions Make sure that you understand and can help the patient follow any healthcare provider instructions for all care.  Provide for the patient's basic needs You should help the patient with basic needs in the home and provide support for getting groceries, prescriptions, and other personal needs.  Monitor the patient's symptoms If they are getting sicker, call his or her medical provider and tell them that the patient has, or is being evaluated for, COVID-19 infection. This will help the healthcare provider's office take steps to keep other people from getting infected. Ask the healthcare provider to call the local or state health department.  Limit the number of people who have contact with the patient If possible, have only one caregiver for the patient. Other household members should stay in another home or place of residence. If this is not possible, they should stay in another room, or be separated from the patient as much as possible. Use a separate bathroom, if available. Restrict visitors who do not have an essential need to be in the home.  Keep older adults, very young children, and other sick people away from the patient Keep older adults, very young children, and those who have compromised immune systems or chronic health conditions away from the patient. This includes people with chronic heart, lung, or kidney conditions, diabetes, and cancer.  Ensure good ventilation Make sure that shared spaces in the home have good air flow, such as from an air conditioner or an opened window, weather permitting.  Wash your hands often Wash your hands often and thoroughly with  soap and water for at least 20 seconds. You can use an alcohol based hand sanitizer if soap and water are not available and if your hands are not visibly dirty. Avoid touching your eyes, nose, and mouth with unwashed hands. Use disposable paper towels to dry your hands. If not available, use dedicated cloth towels and replace them when they become wet.  Wear a facemask and gloves Wear a disposable facemask at all times in the room and gloves when you touch or have contact with the patient's blood, body fluids, and/or secretions or excretions, such as sweat, saliva, sputum, nasal mucus, vomit, urine, or feces.  Ensure the mask fits over your nose and mouth tightly, and do not touch it during use. Throw out disposable facemasks and gloves after using them. Do not reuse. Wash your hands immediately after removing your facemask and gloves. If your personal clothing becomes contaminated, carefully remove clothing and launder. Wash your hands after handling contaminated clothing. Place all used disposable facemasks, gloves, and other waste in a lined container before disposing them with other household waste. Remove gloves and wash your hands immediately after handling these items.  Do not  share dishes, glasses, or other household items with the patient Avoid sharing household items. You should not share dishes, drinking glasses, cups, eating utensils, towels, bedding, or other items with a patient who is confirmed to have, or being evaluated for, COVID-19 infection. After the person uses these items, you should wash them thoroughly with soap and water.  Wash laundry thoroughly Immediately remove and wash clothes or bedding that have blood, body fluids, and/or secretions or excretions, such as sweat, saliva, sputum, nasal mucus, vomit, urine, or feces, on them. Wear gloves when handling laundry from the patient. Read and follow directions on labels of laundry or clothing items and detergent. In general,  wash and dry with the warmest temperatures recommended on the label.  Clean all areas the individual has used often Clean all touchable surfaces, such as counters, tabletops, doorknobs, bathroom fixtures, toilets, phones, keyboards, tablets, and bedside tables, every day. Also, clean any surfaces that may have blood, body fluids, and/or secretions or excretions on them. Wear gloves when cleaning surfaces the patient has come in contact with. Use a diluted bleach solution (e.g., dilute bleach with 1 part bleach and 10 parts water) or a household disinfectant with a label that says EPA-registered for coronaviruses. To make a bleach solution at home, add 1 tablespoon of bleach to 1 quart (4 cups) of water. For a larger supply, add  cup of bleach to 1 gallon (16 cups) of water. Read labels of cleaning products and follow recommendations provided on product labels. Labels contain instructions for safe and effective use of the cleaning product including precautions you should take when applying the product, such as wearing gloves or eye protection and making sure you have good ventilation during use of the product. Remove gloves and wash hands immediately after cleaning.  Monitor yourself for signs and symptoms of illness Caregivers and household members are considered close contacts, should monitor their health, and will be asked to limit movement outside of the home to the extent possible. Follow the monitoring steps for close contacts listed on the symptom monitoring form.   ? If you have additional questions, contact your local health department or call the epidemiologist on call at [phone] (available 24/7). ? This guidance is subject to change. For the most up-to-date guidance from CDC, please refer to their website: TripMetro.hu   Increase activity slowly   Complete by:  As directed    [facility] COVID-19 HOME MONITORING PROGRAM    Complete by:  As directed    Is the patient willing to use the MyChart Mobile App for home monitoring?:  No     Allergies as of 12/01/2018      Reactions   Ceclor [cefaclor] Nausea And Vomiting   Tolerates ceftriaxone fine   Amoxicillin Rash   Mild rash as a young child. Unknown cephalosporin history/not tried      Medication List    TAKE these medications   acetaminophen 500 MG tablet Commonly known as:  TYLENOL Take 1,000 mg by mouth every 6 (six) hours as needed for moderate pain.   cefdinir 300 MG capsule Commonly known as:  OMNICEF Take 1 capsule (300 mg total) by mouth 2 (two) times daily for 3 days.   dicyclomine 10 MG/5ML syrup Commonly known as:  BENTYL Take 10 mg by mouth 3 (three) times daily before meals.   metroNIDAZOLE 500 MG tablet Commonly known as:  Flagyl Take 1 tablet (500 mg total) by mouth 3 (three) times daily for 3 days.  nystatin 100000 UNIT/ML suspension Commonly known as:  MYCOSTATIN Take 5 mLs (500,000 Units total) by mouth 4 (four) times daily for 4 days.   Xulane 150-35 MCG/24HR transdermal patch Generic drug:  norelgestromin-ethinyl estradiol Place 1 patch onto the skin See admin instructions. Change every 10 days      Follow-up Information    Abigail Miyamoto, MD. Schedule an appointment as soon as possible for a visit in 1 week(s).   Specialty:  Family Medicine Contact information: [address] [place] [address] [phone]        Charlott Rakes, MD. Schedule an appointment as soon as possible for a visit in 2 week(s).   Specialty:  Gastroenterology Why:  For elective outpatient colonoscopy Contact information: [address] [place] [address] [phone]          Allergies  Allergen Reactions   Ceclor [Cefaclor] Nausea And Vomiting    Tolerates ceftriaxone fine   Amoxicillin Rash    Mild rash as a young child. Unknown cephalosporin history/not tried     Consultations:   GI   Other Procedures/Studies: Ct Abdomen Pelvis W Contrast  Result Date: 11/25/2018 CLINICAL DATA:  Abdominal pain EXAM: CT ABDOMEN AND PELVIS WITH CONTRAST TECHNIQUE: Multidetector CT imaging of the abdomen and pelvis was performed using the standard protocol following bolus administration of intravenous contrast. CONTRAST:  OMNIPAQUE IOHEXOL 300 MG/ML  SOLN COMPARISON:  None. FINDINGS: Lower chest: Extensive, irregular ground-glass and heterogeneous opacity of the included bilateral lung bases. Hepatobiliary: No focal liver abnormality is seen. No gallstones, gallbladder wall thickening, or biliary dilatation. Pancreas: Unremarkable. No pancreatic ductal dilatation or surrounding inflammatory changes. Spleen: Normal in size without focal abnormality. Adrenals/Urinary Tract: Adrenal glands are unremarkable. Kidneys are normal, without renal calculi, focal lesion, or hydronephrosis. Bladder is unremarkable. Stomach/Bowel: Stomach is within normal limits. Appendix appears normal. Mild hyperenhancement of the distal descending colon, sigmoid, and rectum, with a somewhat featureless appearance (series 2, image 62, series 7, image 108). Vascular/Lymphatic: No significant vascular findings are present. No enlarged abdominal or pelvic lymph nodes. Reproductive: No mass or other abnormality. Other: No abdominal wall hernia or abnormality. No abdominopelvic ascites. Musculoskeletal: No acute or significant osseous findings. IMPRESSION: 1. Mild hyperenhancement of the distal descending colon, sigmoid, and rectum, with a somewhat featureless appearance (series 2, image 62, series 7, image 108). This appearance is consistent with nonspecific infectious or inflammatory proctocolitis, and particularly inflammatory bowel disease such as ulcerative colitis is a consideration with this appearance. 2. Extensive, irregular ground-glass and heterogeneous opacity of the included bilateral lung  bases, consistent with infection or aspiration. Electronically Signed   By: Lauralyn Primes M.D.   On: 11/25/2018 22:01   Dg Chest Port 1 View  Result Date: 11/28/2018 CLINICAL DATA:  Pain EXAM: PORTABLE CHEST 1 VIEW COMPARISON:  11/25/2018 FINDINGS: Patchy and hazy airspace disease in both central and basilar lungs. Normal heart size. No pneumothorax. No pleural effusion. IMPRESSION: Bilateral hazy and patchy airspace opacities. Followup PA and lateral chest X-ray is recommended in 3-4 weeks following trial of antibiotic therapy to ensure resolution and exclude underlying malignancy. Electronically Signed   By: Jolaine Click M.D.   On: 11/28/2018 13:29   Dg Chest Port 1 View  Result Date: 11/25/2018 CLINICAL DATA:  Vomiting, diarrhea, history of cerebral palsy EXAM: PORTABLE CHEST 1 VIEW COMPARISON:  None. FINDINGS: Cardiomegaly. Left basilar heterogeneous opacity and probable small layering pleural effusion. IMPRESSION: Cardiomegaly. Left basilar heterogeneous opacity  and probable small layering pleural effusion, concerning for infection or aspiration. Electronically Signed   By: Lauralyn PrimesAlex  Bibbey M.D.   On: 11/25/2018 19:39   Dg Abd Portable 1v  Result Date: 11/29/2018 CLINICAL DATA:  Abdominal pain EXAM: PORTABLE ABDOMEN - 1 VIEW COMPARISON:  Four days ago FINDINGS: Normal bowel gas pattern. Scoliosis. There is history of cerebral palsy. No concerning mass effect or gas collection. IMPRESSION: Normal bowel gas pattern. Electronically Signed   By: Marnee SpringJonathon  Watts M.D.   On: 11/29/2018 09:35      TODAY-DAY OF DISCHARGE:  Subjective:   Tina Oliver today has no headache,no chest abdominal pain,no new weakness tingling or numbness, feels much better wants to go home today.   Objective:   Blood pressure 95/78, pulse 88, temperature 98.7 F (37.1 C), temperature source Axillary, resp. rate 16, height 5\' 3"  (1.6 m), weight 68.1 kg, SpO2 93 %.  Intake/Output Summary (Last 24 hours) at 12/01/2018  1055 Last data filed at 12/01/2018 0545 Gross per 24 hour  Intake 130 ml  Output 750 ml  Net -620 ml   Filed Weights   11/25/18 1611 11/26/18 0230  Weight: 68 kg 68.1 kg    Exam: Awake Alert, Oriented *3, No new F.N deficits, Normal affect [place].AT,PERRAL Supple Neck,No JVD, No cervical lymphadenopathy appriciated.  Symmetrical Chest wall movement, Good air movement bilaterally, CTAB RRR,No Gallops,Rubs or new Murmurs, No Parasternal Heave +ve B.Sounds, Abd Soft, Non tender, No organomegaly appriciated, No rebound -guarding or rigidity. No Cyanosis, Clubbing or edema, No new Rash or bruise   PERTINENT RADIOLOGIC STUDIES: Ct Abdomen Pelvis W Contrast  Result Date: 11/25/2018 CLINICAL DATA:  Abdominal pain EXAM: CT ABDOMEN AND PELVIS WITH CONTRAST TECHNIQUE: Multidetector CT imaging of the abdomen and pelvis was performed using the standard protocol following bolus administration of intravenous contrast. CONTRAST:  100mL OMNIPAQUE IOHEXOL 300 MG/ML  SOLN COMPARISON:  None. FINDINGS: Lower chest: Extensive, irregular ground-glass and heterogeneous opacity of the included bilateral lung bases. Hepatobiliary: No focal liver abnormality is seen. No gallstones, gallbladder wall thickening, or biliary dilatation. Pancreas: Unremarkable. No pancreatic ductal dilatation or surrounding inflammatory changes. Spleen: Normal in size without focal abnormality. Adrenals/Urinary Tract: Adrenal glands are unremarkable. Kidneys are normal, without renal calculi, focal lesion, or hydronephrosis. Bladder is unremarkable. Stomach/Bowel: Stomach is within normal limits. Appendix appears normal. Mild hyperenhancement of the distal descending colon, sigmoid, and rectum, with a somewhat featureless appearance (series 2, image 62, series 7, image 108). Vascular/Lymphatic: No significant vascular findings are present. No enlarged abdominal or pelvic lymph nodes. Reproductive: No mass or other abnormality. Other: No  abdominal wall hernia or abnormality. No abdominopelvic ascites. Musculoskeletal: No acute or significant osseous findings. IMPRESSION: 1. Mild hyperenhancement of the distal descending colon, sigmoid, and rectum, with a somewhat featureless appearance (series 2, image 62, series 7, image 108). This appearance is consistent with nonspecific infectious or inflammatory proctocolitis, and particularly inflammatory bowel disease such as ulcerative colitis is a consideration with this appearance. 2. Extensive, irregular ground-glass and heterogeneous opacity of the included bilateral lung bases, consistent with infection or aspiration. Electronically Signed   By: Lauralyn PrimesAlex  Bibbey M.D.   On: 11/25/2018 22:01   Dg Chest Port 1 View  Result Date: 11/28/2018 CLINICAL DATA:  Pain EXAM: PORTABLE CHEST 1 VIEW COMPARISON:  11/25/2018 FINDINGS: Patchy and hazy airspace disease in both central and basilar lungs. Normal heart size. No pneumothorax. No pleural effusion. IMPRESSION: Bilateral hazy and patchy airspace opacities. Followup PA and lateral chest X-ray  is recommended in 3-4 weeks following trial of antibiotic therapy to ensure resolution and exclude underlying malignancy. Electronically Signed   By: Jolaine Click M.D.   On: 11/28/2018 13:29   Dg Chest Port 1 View  Result Date: 11/25/2018 CLINICAL DATA:  Vomiting, diarrhea, history of cerebral palsy EXAM: PORTABLE CHEST 1 VIEW COMPARISON:  None. FINDINGS: Cardiomegaly. Left basilar heterogeneous opacity and probable small layering pleural effusion. IMPRESSION: Cardiomegaly. Left basilar heterogeneous opacity and probable small layering pleural effusion, concerning for infection or aspiration. Electronically Signed   By: Lauralyn Primes M.D.   On: 11/25/2018 19:39   Dg Abd Portable 1v  Result Date: 11/29/2018 CLINICAL DATA:  Abdominal pain EXAM: PORTABLE ABDOMEN - 1 VIEW COMPARISON:  Four days ago FINDINGS: Normal bowel gas pattern. Scoliosis. There is history of  cerebral palsy. No concerning mass effect or gas collection. IMPRESSION: Normal bowel gas pattern. Electronically Signed   By: Marnee Spring M.D.   On: 11/29/2018 09:35     PERTINENT LAB RESULTS: CBC: Recent Labs    11/29/18 0539 11/30/18 0402  WBC 3.1* 3.4*  HGB 11.2* 12.2  HCT 34.9* 38.3  PLT 266 307   CMET CMP     Component Value Date/Time   NA 139 11/30/2018 0402   K 4.8 11/30/2018 0402   CL 104 11/30/2018 0402   CO2 26 11/30/2018 0402   GLUCOSE 78 11/30/2018 0402   BUN <5 (L) 11/30/2018 0402   CREATININE 0.52 11/30/2018 0402   CALCIUM 8.6 (L) 11/30/2018 0402   PROT 6.3 (L) 11/30/2018 0402   ALBUMIN 2.4 (L) 11/30/2018 0402   AST 26 11/30/2018 0402   ALT 16 11/30/2018 0402   ALKPHOS 55 11/30/2018 0402   BILITOT 0.3 11/30/2018 0402   GFRNONAA >60 11/30/2018 0402   GFRAA >60 11/30/2018 0402    GFR Estimated Creatinine Clearance: 94.4 mL/min (by C-G formula based on SCr of 0.52 mg/dL). No results for input(s): LIPASE, AMYLASE in the last 72 hours. No results for input(s): CKTOTAL, CKMB, CKMBINDEX, TROPONINI in the last 72 hours. Invalid input(s): POCBNP Recent Labs    11/29/18 0539 11/30/18 0402  DDIMER 0.99* 0.83*   No results for input(s): HGBA1C in the last 72 hours. No results for input(s): CHOL, HDL, LDLCALC, TRIG, CHOLHDL, LDLDIRECT in the last 72 hours. No results for input(s): TSH, T4TOTAL, T3FREE, THYROIDAB in the last 72 hours.  Invalid input(s): FREET3 Recent Labs    11/29/18 0534 11/30/18 0402  FERRITIN 280 268   Coags: No results for input(s): INR in the last 72 hours.  Invalid input(s): PT Microbiology: Recent Results (from the past 240 hour(s))  Blood culture (routine x 2)     Status: None   Collection Time: 11/25/18  5:58 PM  Result Value Ref Range Status   Specimen Description BLOOD RIGHT ARM  Final   Special Requests   Final    BOTTLES DRAWN AEROBIC AND ANAEROBIC Blood Culture results may not be optimal due to an excessive  volume of blood received in culture bottles   Culture   Final    NO GROWTH 5 DAYS Performed at [facility], [address], [place], [place] [address]    Report Status 11/30/2018 FINAL  Final  SARS Coronavirus 2 (CEPHEID- Performed in [facility] hospital lab), Hosp Order     Status: Abnormal   Collection Time: 11/26/18  1:54 AM  Result Value Ref Range Status   SARS Coronavirus 2 POSITIVE (A) NEGATIVE Final  Comment: CRITICAL RESULT CALLED TO, READ BACK BY AND VERIFIED WITH: CAMPBELL,M @ 0344 ON [id number] BY POTEAT,S (NOTE) If result is NEGATIVE SARS-CoV-2 target nucleic acids are NOT DETECTED. The SARS-CoV-2 RNA is generally detectable in upper and lower  respiratory specimens during the acute phase of infection. The lowest  concentration of SARS-CoV-2 viral copies this assay can detect is 250  copies / mL. A negative result does not preclude SARS-CoV-2 infection  and should not be used as the sole basis for treatment or other  patient management decisions.  A negative result may occur with  improper specimen collection / handling, submission of specimen other  than nasopharyngeal swab, presence of viral mutation(s) within the  areas targeted by this assay, and inadequate number of viral copies  (<250 copies / mL). A negative result must be combined with clinical  observations, patient history, and epidemiological information. If result is POSITIVE SARS-CoV-2 target nucleic acids are  DETECTED. The SARS-CoV-2 RNA is generally detectable in upper and lower  respiratory specimens during the acute phase of infection.  Positive  results are indicative of active infection with SARS-CoV-2.  Clinical  correlation with patient history and other diagnostic information is  necessary to determine patient infection status.  Positive results do  not rule out bacterial infection or co-infection with other viruses. If result is PRESUMPTIVE POSTIVE SARS-CoV-2 nucleic acids MAY BE PRESENT.    A presumptive positive result was obtained on the submitted specimen  and confirmed on repeat testing.  While 2019 novel coronavirus  (SARS-CoV-2) nucleic acids may be present in the submitted sample  additional confirmatory testing may be necessary for epidemiological  and / or clinical management purposes  to differentiate between  SARS-CoV-2 and other Sarbecovirus currently known to infect humans.  If clinically indicated additional testing with an alternate test  methodology  [phone]) is advised. The SARS-CoV-2 RNA is generally  detectable in upper and lower respiratory specimens during the acute  phase of infection. The expected result is Negative. Fact Sheet for Patients:  BoilerBrush.com.cy Fact Sheet for Healthcare Providers: https://pope.com/ This test is not yet approved or cleared by the [place] FDA and has been authorized for detection and/or diagnosis of SARS-CoV-2 by FDA under an Emergency Use Authorization (EUA).  This EUA will remain in effect (meaning this test can be used) for the duration of the COVID-19 declaration under Section 564(b)(1) of the Act, 21 U.S.C. section 360bbb-3(b)(1), unless the authorization is terminated or revoked sooner. Performed at [facility], [address], [place], [place] [address]   Blood culture (routine x 2)     Status: None (Preliminary result)   Collection Time: 11/26/18  6:24 AM  Result Value Ref Range Status   Specimen Description   Final    BLOOD RIGHT HAND Performed at [facility], [address], [place], [place] [address]    Special Requests   Final    BOTTLES DRAWN AEROBIC ONLY Blood Culture adequate volume Performed at [facility], [address], [place], [place] [address]    Culture   Final    NO GROWTH 4 DAYS Performed at [facility], [address], [place], [place] [address]    Report Status  PENDING  Incomplete  C difficile quick scan w PCR reflex     Status: None   Collection Time: 11/27/18 10:45 AM  Result Value Ref Range Status   C Diff antigen NEGATIVE NEGATIVE Final   C Diff  toxin NEGATIVE NEGATIVE Final   C Diff interpretation No C. difficile detected.  Final    Comment: Performed at [facility], [address] [place], [place] [address]  Gastrointestinal Panel by PCR , Stool     Status: None   Collection Time: 11/28/18  1:28 PM  Result Value Ref Range Status   Campylobacter species NOT DETECTED NOT DETECTED Final   Plesimonas shigelloides NOT DETECTED NOT DETECTED Final   Salmonella species NOT DETECTED NOT DETECTED Final   Yersinia enterocolitica NOT DETECTED NOT DETECTED Final   Vibrio species NOT DETECTED NOT DETECTED Final   Vibrio cholerae NOT DETECTED NOT DETECTED Final   Enteroaggregative E coli (EAEC) NOT DETECTED NOT DETECTED Final   Enteropathogenic E coli (EPEC) NOT DETECTED NOT DETECTED Final   Enterotoxigenic E coli (ETEC) NOT DETECTED NOT DETECTED Final   Shiga like toxin producing E coli (STEC) NOT DETECTED NOT DETECTED Final   Shigella/Enteroinvasive E coli (EIEC) NOT DETECTED NOT DETECTED Final   Cryptosporidium NOT DETECTED NOT DETECTED Final   Cyclospora cayetanensis NOT DETECTED NOT DETECTED Final   Entamoeba histolytica NOT DETECTED NOT DETECTED Final   Giardia lamblia NOT DETECTED NOT DETECTED Final   Adenovirus F40/41 NOT DETECTED NOT DETECTED Final   Astrovirus NOT DETECTED NOT DETECTED Final   Norovirus GI/GII NOT DETECTED NOT DETECTED Final   Rotavirus A NOT DETECTED NOT DETECTED Final   Sapovirus (I, II, IV, and V) NOT DETECTED NOT DETECTED Final    Comment: Performed at [facility], [address] [place], [place] [address]    FURTHER DISCHARGE INSTRUCTIONS:  Get Medicines reviewed and adjusted: Please take all your medications with you for your next visit with your Primary  MD  Laboratory/radiological data: Please request your Primary MD to go over all hospital tests and procedure/radiological results at the follow up, please ask your Primary MD to get all Hospital records sent to his/her office.  In some cases, they will be blood work, cultures and biopsy results pending at the time of your discharge. Please request that your primary care M.D. goes through all the records of your hospital data and follows up on these results.  Also Note the following: If you experience worsening of your admission symptoms, develop shortness of breath, life threatening emergency, suicidal or homicidal thoughts you must seek medical attention immediately by calling 911 or calling your MD immediately  if symptoms less severe.  You must read complete instructions/literature along with all the possible adverse reactions/side effects for all the Medicines you take and that have been prescribed to you. Take any new Medicines after you have completely understood and accpet all the possible adverse reactions/side effects.   Do not drive when taking Pain medications or sleeping medications (Benzodaizepines)  Do not take more than prescribed Pain, Sleep and Anxiety Medications. It is not advisable to combine anxiety,sleep and pain medications without talking with your primary care practitioner  Special Instructions: If you have smoked or chewed Tobacco  in the last 2 yrs please stop smoking, stop any regular Alcohol  and or any Recreational drug use.  Wear Seat belts while driving.  Please note: You were cared for by a hospitalist during your hospital stay. Once you are discharged, your primary care physician will handle any further medical issues. Please note that NO REFILLS for any discharge medications will be authorized once you are discharged, as it is imperative that you return to your primary care physician (or establish a relationship  with a primary care physician if you do not have  one) for your post hospital discharge needs so that they can reassess your need for medications and monitor your lab values.  Total Time spent coordinating discharge including counseling, education and face to face time equals 35 minutes.  SignedJeoffrey Massed 12/01/2018 10:55 AM

## 2018-12-01 NOTE — Discharge Instructions (Signed)
Person Under Monitoring Name: Tina Oliver  Location: [address] [place] [address]   Infection Prevention Recommendations for Individuals Confirmed to have, or Being Evaluated for, 2019 Novel Coronavirus (COVID-19) Infection Who Receive Care at Home  Individuals who are confirmed to have, or are being evaluated for, COVID-19 should follow the prevention steps below until a healthcare provider or local or state health department says they can return to normal activities.  Stay home except to get medical care You should restrict activities outside your home, except for getting medical care. Do not go to work, school, or public areas, and do not use public transportation or taxis.  Call ahead before visiting your doctor Before your medical appointment, call the healthcare provider and tell them that you have, or are being evaluated for, COVID-19 infection. This will help the healthcare providers office take steps to keep other people from getting infected. Ask your healthcare provider to call the local or state health department.  Monitor your symptoms Seek prompt medical attention if your illness is worsening (e.g., difficulty breathing). Before going to your medical appointment, call the healthcare provider and tell them that you have, or are being evaluated for, COVID-19 infection. Ask your healthcare provider to call the local or state health department.  Wear a facemask You should wear a facemask that covers your nose and mouth when you are in the same room with other people and when you visit a healthcare provider. People who live with or visit you should also wear a facemask while they are in the same room with you.  Separate yourself from other people in your home As much as possible, you should stay in a different room from other people in your home. Also, you should use a separate bathroom, if available.  Avoid sharing household items You should not share  dishes, drinking glasses, cups, eating utensils, towels, bedding, or other items with other people in your home. After using these items, you should wash them thoroughly with soap and water.  Cover your coughs and sneezes Cover your mouth and nose with a tissue when you cough or sneeze, or you can cough or sneeze into your sleeve. Throw used tissues in a lined trash can, and immediately wash your hands with soap and water for at least 20 seconds or use an alcohol-based hand rub.  Wash your [employer] your hands often and thoroughly with soap and water for at least 20 seconds. You can use an alcohol-based hand sanitizer if soap and water are not available and if your hands are not visibly dirty. Avoid touching your eyes, nose, and mouth with unwashed hands.   Prevention Steps for Caregivers and Household Members of Individuals Confirmed to have, or Being Evaluated for, COVID-19 Infection Being Cared for in the Home  If you live with, or provide care at home for, a person confirmed to have, or being evaluated for, COVID-19 infection please follow these guidelines to prevent infection:  Follow healthcare providers instructions Make sure that you understand and can help the patient follow any healthcare provider instructions for all care.  Provide for the patients basic needs You should help the patient with basic needs in the home and provide support for getting groceries, prescriptions, and other personal needs.  Monitor the patients symptoms If they are getting sicker, call his or her medical provider and tell them that the patient has, or is being evaluated for, COVID-19 infection. This will help the healthcare providers office  take steps to keep other people from getting infected. Ask the healthcare provider to call the local or state health department.  Limit the number of people who have contact with the patient  If possible, have only one caregiver for the patient.  Other  household members should stay in another home or place of residence. If this is not possible, they should stay  in another room, or be separated from the patient as much as possible. Use a separate bathroom, if available.  Restrict visitors who do not have an essential need to be in the home.  Keep older adults, very young children, and other sick people away from the patient Keep older adults, very young children, and those who have compromised immune systems or chronic health conditions away from the patient. This includes people with chronic heart, lung, or kidney conditions, diabetes, and cancer.  Ensure good ventilation Make sure that shared spaces in the home have good air flow, such as from an air conditioner or an opened window, weather permitting.  Wash your hands often  Wash your hands often and thoroughly with soap and water for at least 20 seconds. You can use an alcohol based hand sanitizer if soap and water are not available and if your hands are not visibly dirty.  Avoid touching your eyes, nose, and mouth with unwashed hands.  Use disposable paper towels to dry your hands. If not available, use dedicated cloth towels and replace them when they become wet.  Wear a facemask and gloves  Wear a disposable facemask at all times in the room and gloves when you touch or have contact with the patients blood, body fluids, and/or secretions or excretions, such as sweat, saliva, sputum, nasal mucus, vomit, urine, or feces.  Ensure the mask fits over your nose and mouth tightly, and do not touch it during use.  Throw out disposable facemasks and gloves after using them. Do not reuse.  Wash your hands immediately after removing your facemask and gloves.  If your personal clothing becomes contaminated, carefully remove clothing and launder. Wash your hands after handling contaminated clothing.  Place all used disposable facemasks, gloves, and other waste in a lined container before  disposing them with other household waste.  Remove gloves and wash your hands immediately after handling these items.  Do not share dishes, glasses, or other household items with the patient  Avoid sharing household items. You should not share dishes, drinking glasses, cups, eating utensils, towels, bedding, or other items with a patient who is confirmed to have, or being evaluated for, COVID-19 infection.  After the person uses these items, you should wash them thoroughly with soap and water.  Wash laundry thoroughly  Immediately remove and wash clothes or bedding that have blood, body fluids, and/or secretions or excretions, such as sweat, saliva, sputum, nasal mucus, vomit, urine, or feces, on them.  Wear gloves when handling laundry from the patient.  Read and follow directions on labels of laundry or clothing items and detergent. In general, wash and dry with the warmest temperatures recommended on the label.  Clean all areas the individual has used often  Clean all touchable surfaces, such as counters, tabletops, doorknobs, bathroom fixtures, toilets, phones, keyboards, tablets, and bedside tables, every day. Also, clean any surfaces that may have blood, body fluids, and/or secretions or excretions on them.  Wear gloves when cleaning surfaces the patient has come in contact with.  Use a diluted bleach solution (e.g., dilute bleach with 1 part  bleach and 10 parts water) or a household disinfectant with a label that says EPA-registered for coronaviruses. To make a bleach solution at home, add 1 tablespoon of bleach to 1 quart (4 cups) of water. For a larger supply, add  cup of bleach to 1 gallon (16 cups) of water.  Read labels of cleaning products and follow recommendations provided on product labels. Labels contain instructions for safe and effective use of the cleaning product including precautions you should take when applying the product, such as wearing gloves or eye protection  and making sure you have good ventilation during use of the product.  Remove gloves and wash hands immediately after cleaning.  Monitor yourself for signs and symptoms of illness Caregivers and household members are considered close contacts, should monitor their health, and will be asked to limit movement outside of the home to the extent possible. Follow the monitoring steps for close contacts listed on the symptom monitoring form.   ? If you have additional questions, contact your local health department or call the epidemiologist on call at [phone] (available 24/7). ? This guidance is subject to change. For the most up-to-date guidance from [facility], please refer to their website: TripMetro.hu      Person Under Monitoring Name: Tina Oliver  Location: [address] [place] [address]   CORONAVIRUS DISEASE 2019 (COVID-19) Guidance for Persons Under Investigation You are being tested for the virus that causes coronavirus disease 2019 (COVID-19). Public health actions are necessary to ensure protection of your health and the health of others, and to prevent further spread of infection. COVID-19 is caused by a virus that can cause symptoms, such as fever, cough, and shortness of breath. The primary transmission from person to person is by coughing or sneezing. On August 11, 2018, the World Health Organization announced a [employer] Emergency of International Concern and on August 12, 2018 the U.S. Department of Health and Human Services declared a public health emergency. If the virus that causesCOVID-19 spreads in the community, it could have severe public health consequences.  As a person under investigation for COVID-19, the [employer] of Health and [employer], Division of [employer] advises you to adhere to the following guidance until your test results are reported to you. If your test  result is positive, you will receive additional information from your provider and your local health department at that time.   Remain at home until you are cleared by your health provider or public health authorities.   Keep a log of visitors to your home using the form provided. Any visitors to your home must be aware of your isolation status.  If you plan to move to a new address or leave the county, notify the local health department in your county.  Call a doctor or seek care if you have an urgent medical need. Before seeking medical care, call ahead and get instructions from the provider before arriving at the medical office, clinic or hospital. Notify them that you are being tested for the virus that causes COVID-19 so arrangements can be made, as necessary, to prevent transmission to others in the healthcare setting. Next, notify the local health department in your county.  If a medical emergency arises and you need to call 911, inform the first responders that you are being tested for the virus that causes COVID-19. Next, notify the local health department in your county.  Adhere to all guidance set forth by the [facility]  of Public Health for Home Care of patients that is based on guidance from the Center for Disease Control and Prevention with suspected or confirmed COVID-19. It is provided with this guidance for Persons Under Investigation.  Your health and the health of our community are our top priorities. Public Health officials remain available to provide assistance and counseling to you about COVID-19 and compliance with this guidance.  Provider: ____________________________________________________________ Date: ______/_____/_________  By signing below, you acknowledge that you have read and agree to comply with this Guidance for Persons Under Investigation. ______________________________________________________________ Date: ______/_____/_________  WHO DO I  CALL? You can find a list of local health departments here: http://dean.org/https://www.ncdhhs.gov/divisions/public-health/county-healthdepartments Health Department: ____________________________________________________________________ Contact Name: ________________________________________________________________________ Telephone: ___________________________________________________________________________  [place]ra [place]ai[place] DHHS, Division of Public Health, Communicable Disease Branch COVID-19 Guidance for Persons Under Investigation September 17, 2018

## 2018-12-01 NOTE — Progress Notes (Signed)
Patient is ready for discharge. I have printed AVS and a clean copy will be given to patient's mother when patient is taken out to the car. Patient's mother will sign the Dept of Health and Human Resources form and give back to nurse to be placed in patient's chart. I have called Mercy Riding, patient's mother and reviewed all discharge instructions with her and answered all questions regarding patient's discharge. Patient will be taken to the front entrance of the hospital by nurse and meet her mother who will transport her home.

## 2018-12-01 NOTE — Progress Notes (Signed)
Spoke to patient's mother and father about patient. Spoke to them about medications that I would be giving patient and what food we had for patient to eat. Patient's parents spoke to her and encouraged her to eat and take medications given to her. Told patient's mother I would notify her if and when patient would be discharged today. Will contact patient's mother when she is ready to be discharged

## 2018-12-09 ENCOUNTER — Telehealth (INDEPENDENT_AMBULATORY_CARE_PROVIDER_SITE_OTHER): Payer: Self-pay | Admitting: Legal Medicine

## 2018-12-09 NOTE — Telephone Encounter (Signed)
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 Pt does not want MyChart and will f/u with PCP.

## 2020-01-11 ENCOUNTER — Other Ambulatory Visit (INDEPENDENT_AMBULATORY_CARE_PROVIDER_SITE_OTHER): Payer: Self-pay | Admitting: Legal Medicine

## 2020-01-11 ENCOUNTER — Other Ambulatory Visit: Payer: Self-pay | Admitting: Legal Medicine

## 2020-01-11 MED ORDER — NYSTATIN 100000 UNIT/GM EX CREA: TOPICAL_CREAM | Freq: Two times a day (BID) | CUTANEOUS | 3 refills | Status: DC

## 2020-03-21 ENCOUNTER — Other Ambulatory Visit: Payer: Self-pay | Admitting: Legal Medicine

## 2020-07-19 IMAGING — CT CT ABDOMEN AND PELVIS WITH CONTRAST
2 of 5 series · 16 of 46 positions shown, 18 images · IV contrast (ISOVUE)
Comparison: None.

CLINICAL DATA: Abdominal pain

EXAM:
CT ABDOMEN AND PELVIS WITH CONTRAST
TECHNIQUE: Multidetector CT imaging of the abdomen and pelvis was performed
using the standard protocol following bolus administration of
intravenous contrast.
CONTRAST:  100mL OMNIPAQUE IOHEXOL 300 MG/ML  SOLN

[Series 2: axial st · axial · 0.94mm/px · z∈[-853,-438]mm · 13 of 93 slices shown, 15 images]
[im 5/93  soft-tissue]
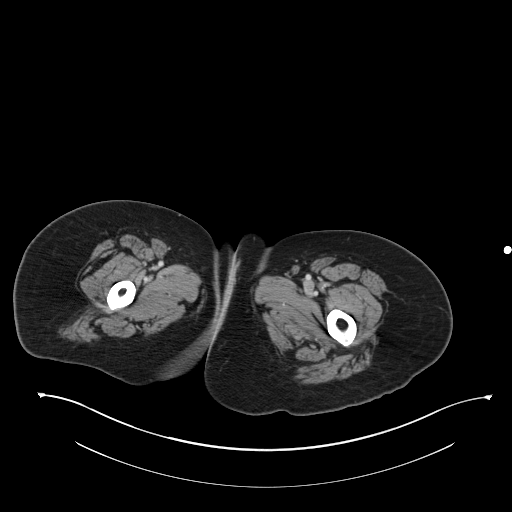
[im 5/93  bone]
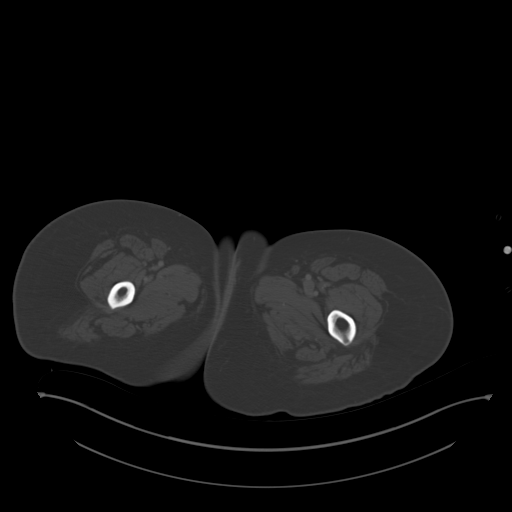
[im 15/93  soft-tissue]
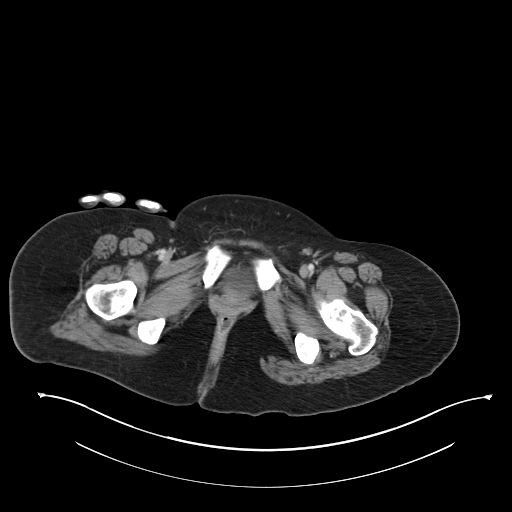
[im 20/93  soft-tissue]
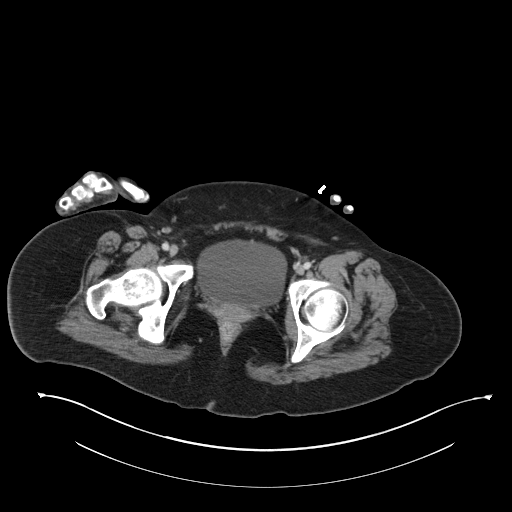
[im 25/93  soft-tissue]
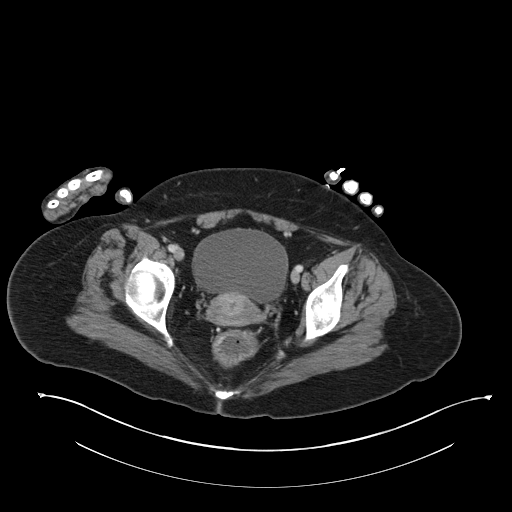
[im 34/93  soft-tissue]
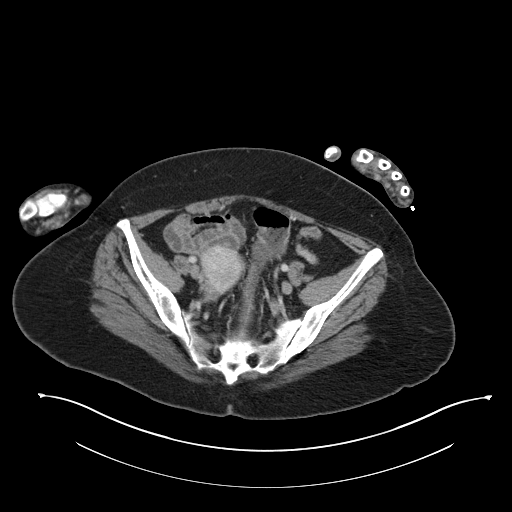
[im 39/93  soft-tissue]
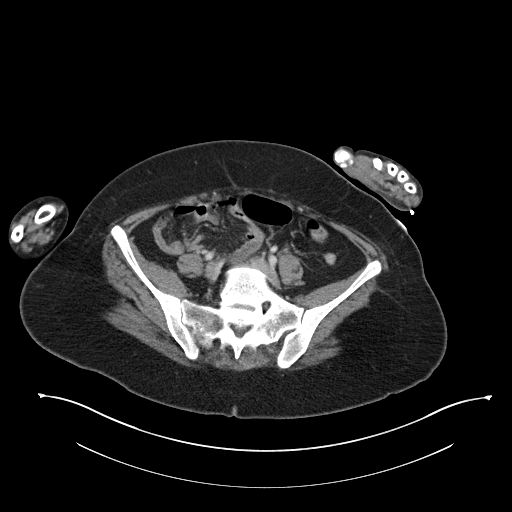
[im 49/93  soft-tissue]
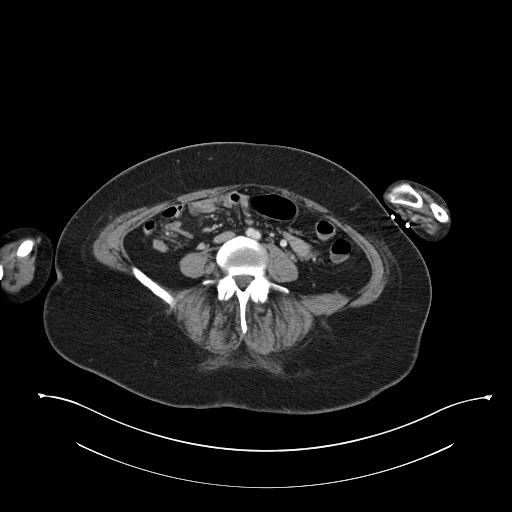
[im 54/93  soft-tissue]
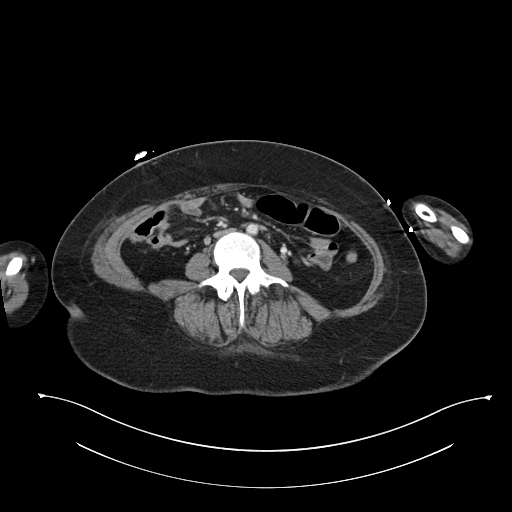
[im 59/93  soft-tissue]
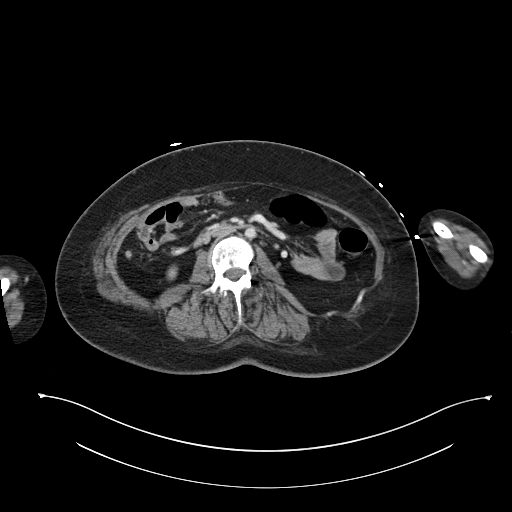
[im 59/93  bone]
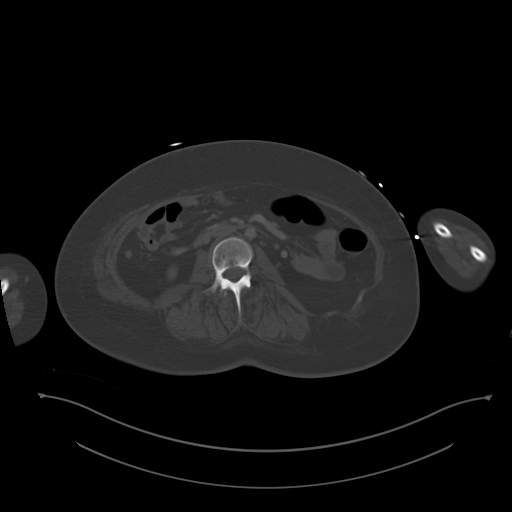
[im 68/93  soft-tissue]
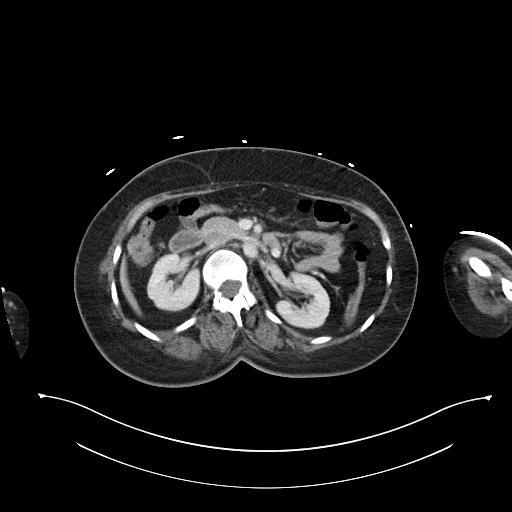
[im 73/93  soft-tissue]
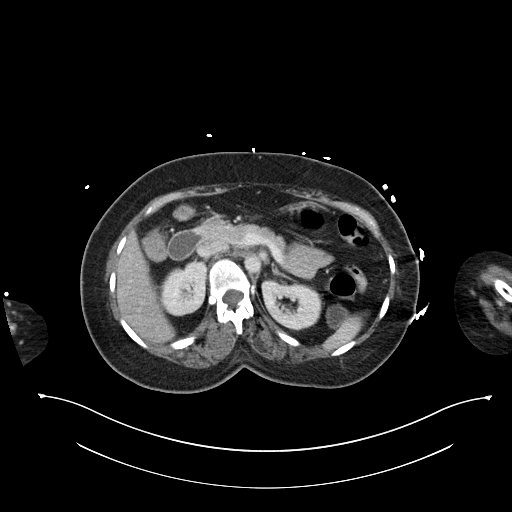
[im 78/93  soft-tissue]
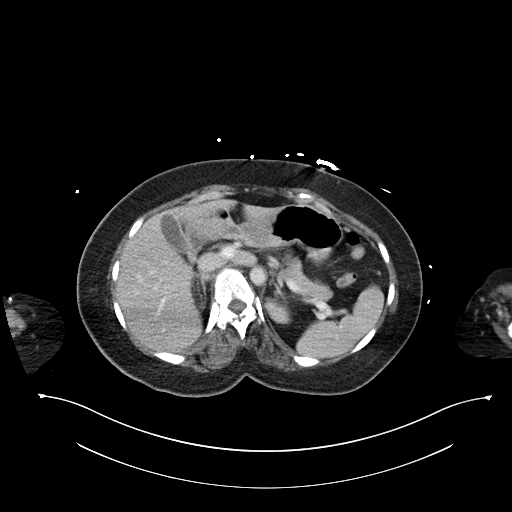
[im 88/93  soft-tissue]
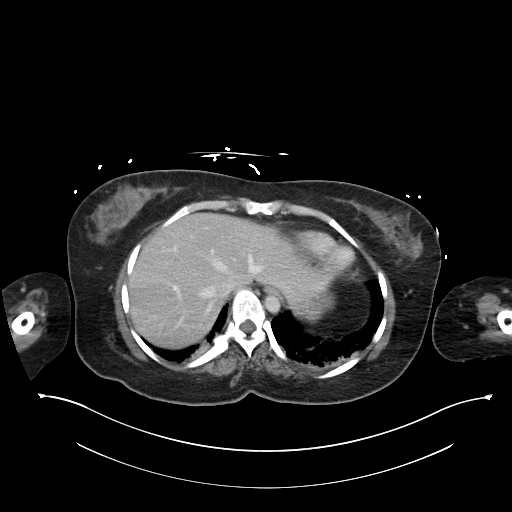

[Series 6: coronal st · coronal · 0.88mm/px · 3 of 147 slices shown]
[im 49/147  soft-tissue]
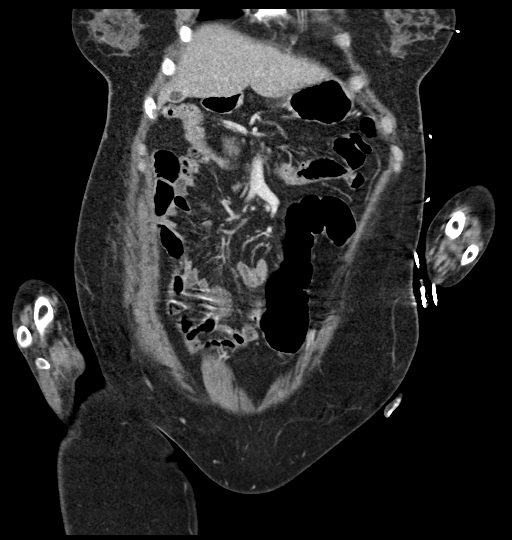
[im 65/147  soft-tissue]
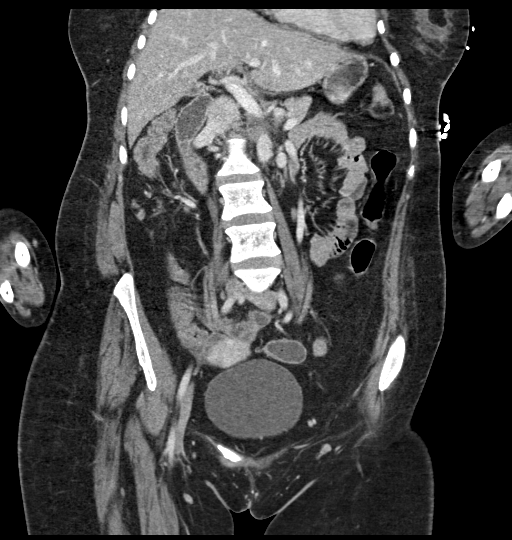
[im 82/147  soft-tissue]
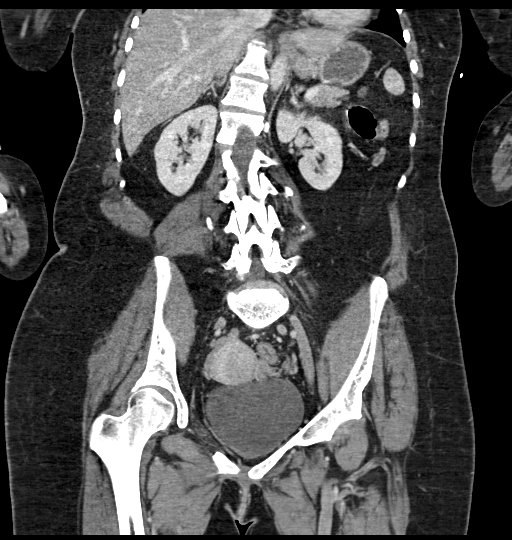

[16 of 46 positions shown; findings below may reference images not displayed]

FINDINGS: Lower chest: Extensive, irregular ground-glass and heterogeneous
opacity of the included bilateral lung bases.

Hepatobiliary: No focal liver abnormality is seen. No gallstones,
gallbladder wall thickening, or biliary dilatation.

Pancreas: Unremarkable. No pancreatic ductal dilatation or
surrounding inflammatory changes.

Spleen: Normal in size without focal abnormality.

Adrenals/Urinary Tract: Adrenal glands are unremarkable. Kidneys are
normal, without renal calculi, focal lesion, or hydronephrosis.
Bladder is unremarkable.

Stomach/Bowel: Stomach is within normal limits. Appendix appears
normal. Mild hyperenhancement of the distal descending colon,
sigmoid, and rectum, with a somewhat featureless appearance (series
2, image 62, series 7, image 108).

Vascular/Lymphatic: No significant vascular findings are present. No
enlarged abdominal or pelvic lymph nodes.

Reproductive: No mass or other abnormality.

Other: No abdominal wall hernia or abnormality. No abdominopelvic
ascites.

Musculoskeletal: No acute or significant osseous findings.
IMPRESSION: 1. Mild hyperenhancement of the distal descending colon, sigmoid,
and rectum, with a somewhat featureless appearance (series 2, image
62, series 7, image 108). This appearance is consistent with
nonspecific infectious or inflammatory proctocolitis, and
particularly inflammatory bowel disease such as ulcerative colitis
is a consideration with this appearance.

2. Extensive, irregular ground-glass and heterogeneous opacity of
the included bilateral lung bases, consistent with infection or
aspiration.

## 2020-11-28 ENCOUNTER — Other Ambulatory Visit: Payer: Self-pay | Admitting: Legal Medicine

## 2021-03-04 ENCOUNTER — Other Ambulatory Visit: Payer: Self-pay | Admitting: Legal Medicine

## 2021-06-19 ENCOUNTER — Encounter: Payer: Self-pay | Admitting: Legal Medicine

## 2021-06-19 ENCOUNTER — Ambulatory Visit: Payer: Medicaid Other | Admitting: Legal Medicine

## 2021-06-19 ENCOUNTER — Other Ambulatory Visit: Payer: Self-pay

## 2021-06-19 DIAGNOSIS — H547 Unspecified visual loss: Secondary | ICD-10-CM | POA: Insufficient documentation

## 2021-06-19 DIAGNOSIS — G7100 Muscular dystrophy, unspecified: Secondary | ICD-10-CM | POA: Insufficient documentation

## 2021-06-19 DIAGNOSIS — H540X55 Blindness right eye category 5, blindness left eye category 5: Secondary | ICD-10-CM | POA: Diagnosis not present

## 2021-06-19 DIAGNOSIS — F71 Moderate intellectual disabilities: Secondary | ICD-10-CM | POA: Diagnosis not present

## 2021-06-19 DIAGNOSIS — Z0000 Encounter for general adult medical examination without abnormal findings: Secondary | ICD-10-CM | POA: Diagnosis not present

## 2021-06-19 NOTE — Progress Notes (Signed)
Subjective:  Patient ID: Tina Oliver, female    DOB: 1985-08-16  Age: 35 y.o. MRN: [id number]  Chief Complaint  Patient presents with   Annual Exam    HPI Well Adult Physical: Patient here for a comprehensive physical exam.The patient reports no problems.  Intelectual difficulty Do you take any herbs or supplements that were not prescribed by a doctor? no Are you taking calcium supplements? no Are you taking aspirin daily? no  Encounter for general adult medical examination without abnormal findings  Physical ("At Risk" items are starred): Patient's last physical exam was 1 year ago .  Patient is not afflicted from Stress Incontinence and Urge Incontinence  Patient wears a seat belt, has smoke detectors, has carbon monoxide detectors, practices appropriate gun safety, and wears sunscreen with extended sun exposure. Dental Care: biannual cleanings, brushes and flosses daily. Ophthalmology/Optometry: Annual visit.  Hearing loss: none Vision impairments: none Feet swell  Flowsheet Row Office Visit from 06/19/2021 in [facility]  PHQ-2 Total Score 0               Social Hx   Social History   Socioeconomic History   Marital status: Single    Spouse name: Not on file   Number of children: Not on file   Years of education: Not on file   Highest education level: Not on file  Occupational History   Not on file  Tobacco Use   Smoking status: Never   Smokeless tobacco: Never  Substance and Sexual Activity   Alcohol use: Never   Drug use: Never   Sexual activity: Not on file  Other Topics Concern   Not on file  Social History Narrative   Not on file   Social Determinants of Health   Financial Resource Strain: Not on file  Food Insecurity: Not on file  Transportation Needs: Not on file  Physical Activity: Not on file  Stress: Not on file  Social Connections: Not on file   Past Medical History:  Diagnosis Date   Cerebral palsy (HCC)    Muscular  dystrophy (HCC)    Scoliosis    Total blindness    both eyes   History reviewed. No pertinent surgical history.  History reviewed. No pertinent family history.  Review of Systems  Constitutional:  Negative for activity change and appetite change.  HENT:  Negative for congestion.   Eyes:  Negative for visual disturbance.  Respiratory:  Negative for chest tightness and shortness of breath.   Cardiovascular:  Negative for chest pain, palpitations and leg swelling.  Gastrointestinal:  Negative for abdominal distention and abdominal pain.  Endocrine: Negative for polyuria.  Genitourinary:  Negative for difficulty urinating and dyspareunia.  Musculoskeletal:  Negative for arthralgias and back pain.  Neurological: Negative.   Psychiatric/Behavioral: Negative.      Objective:  BP 120/70   Pulse 81   Temp (!) 97.4 F (36.3 C)   Resp 16   Ht 5\' 3"  (1.6 m)   Wt 154 lb (69.9 kg)   LMP  (LMP Unknown)   SpO2 97%   BMI 27.28 kg/m   BP/Weight 06/19/2021 12/01/2018 11/26/2018  Systolic BP 120 111 -  Diastolic BP 70 67 -  Wt. (Lbs) 154 - 150.13  BMI 27.28 - 26.59    Physical Exam Vitals reviewed.  Constitutional:      General: She is not in acute distress.    Appearance: Normal appearance.  HENT:     Head: Normocephalic.  Right Ear: Tympanic membrane, ear canal and external ear normal.     Left Ear: Tympanic membrane, ear canal and external ear normal.     Mouth/Throat:     Mouth: Mucous membranes are moist.  Eyes:     Extraocular Movements: Extraocular movements intact.     Conjunctiva/sclera: Conjunctivae normal.     Pupils: Pupils are equal, round, and reactive to light.  Cardiovascular:     Rate and Rhythm: Regular rhythm. Tachycardia present.     Pulses: Normal pulses.     Heart sounds: No murmur heard.   No gallop.  Pulmonary:     Effort: Pulmonary effort is normal. No respiratory distress.     Breath sounds: Normal breath sounds. No wheezing.  Abdominal:      General: Abdomen is flat. Bowel sounds are normal. There is no distension.     Palpations: Abdomen is soft.     Tenderness: There is no abdominal tenderness.  Musculoskeletal:        General: Normal range of motion.     Cervical back: Normal range of motion and neck supple.     Right lower leg: No edema.     Left lower leg: No edema.     Comments: scoliosis  Skin:    General: Skin is warm.     Capillary Refill: Capillary refill takes less than 2 seconds.  Neurological:     General: No focal deficit present.     Mental Status: She is alert and oriented to person, place, and time. Mental status is at baseline.     Gait: Gait normal.     Deep Tendon Reflexes: Reflexes normal.    Lab Results  Component Value Date   WBC 3.4 (L) 11/30/2018   HGB 12.2 11/30/2018   HCT 38.3 11/30/2018   PLT 307 11/30/2018   GLUCOSE 78 11/30/2018   ALT 16 11/30/2018   AST 26 11/30/2018   NA 139 11/30/2018   K 4.8 11/30/2018   CL 104 11/30/2018   CREATININE 0.52 11/30/2018   BUN <5 (L) 11/30/2018   CO2 26 11/30/2018      Assessment & Plan:   Problem List Items Addressed This Visit       Musculoskeletal and Integument  Routine general medical examination at a health care facility  Relevant Orders  CBC with Differential/Platelet  Comprehensive metabolic panel  TSH  Hemoglobin A1c Routine physical no acute changes      Muscular dystrophy (HCC) Chronic problem with scoliosis, patient is active     Other                     Blindness Chronic blindness since child   Other Visit Diagnoses              Body mass index is 27.28 kg/m.   These are the goals we discussed:  Goals      Increase physical activity         This is a list of the screening recommended for you and due dates:  Health Maintenance  Topic Date Due   Tetanus Vaccine  Never done   Pap Smear  Never done   Flu Shot  10/10/2021*   Hepatitis C Screening: USPSTF Recommendation to screen - Ages 18-79  yo.  06/19/2022*   HIV Screening  Completed   Pneumococcal Vaccination  Aged Out   HPV Vaccine  Aged Out  *Topic was postponed. The date shown is not the original due  date.        Follow-up: Return in about 1 year (around 06/19/2022).  An After Visit Summary was printed and given to the patient.  Reinaldo Meeker, MD [facility] [phone]

## 2021-06-20 LAB — COMPREHENSIVE METABOLIC PANEL
ALT: 13 IU/L (ref 0–32)
AST: 22 IU/L (ref 0–40)
Albumin/Globulin Ratio: 1.2 (ref 1.2–2.2)
Albumin: 4.3 g/dL (ref 3.8–4.8)
Alkaline Phosphatase: 90 IU/L (ref 44–121)
BUN/Creatinine Ratio: 12 (ref 9–23)
BUN: 9 mg/dL (ref 6–20)
Bilirubin Total: <0.2 mg/dL (ref 0.0–1.2)
CO2: 18 mmol/L — ABNORMAL LOW (ref 20–29)
Calcium: 10.4 mg/dL — ABNORMAL HIGH (ref 8.7–10.2)
Chloride: 102 mmol/L (ref 96–106)
Creatinine, Ser: 0.74 mg/dL (ref 0.57–1.00)
Globulin, Total: 3.6 g/dL (ref 1.5–4.5)
Glucose: 76 mg/dL (ref 70–99)
Potassium: 4.5 mmol/L (ref 3.5–5.2)
Sodium: 140 mmol/L (ref 134–144)
Total Protein: 7.9 g/dL (ref 6.0–8.5)
eGFR: 109 mL/min/{1.73_m2} (ref >59)

## 2021-06-20 LAB — CBC WITH DIFFERENTIAL/PLATELET
Basophils Absolute: 0.1 10*3/uL (ref 0.0–0.2)
Basos: 1 %
EOS (ABSOLUTE): 0.1 10*3/uL (ref 0.0–0.4)
Eos: 2 %
Hematocrit: 48.9 % — ABNORMAL HIGH (ref 34.0–46.6)
Hemoglobin: 16.1 g/dL — ABNORMAL HIGH (ref 11.1–15.9)
Immature Grans (Abs): 0 10*3/uL (ref 0.0–0.1)
Immature Granulocytes: 0 %
Lymphocytes Absolute: 2.8 10*3/uL (ref 0.7–3.1)
Lymphs: 53 %
MCH: 27.1 pg (ref 26.6–33.0)
MCHC: 32.9 g/dL (ref 31.5–35.7)
MCV: 82 fL (ref 79–97)
Monocytes Absolute: 0.3 10*3/uL (ref 0.1–0.9)
Monocytes: 6 %
Neutrophils Absolute: 2 10*3/uL (ref 1.4–7.0)
Neutrophils: 38 %
Platelets: 244 10*3/uL (ref 150–450)
RBC: 5.95 x10E6/uL — ABNORMAL HIGH (ref 3.77–5.28)
RDW: 13.4 % (ref 11.7–15.4)
WBC: 5.3 10*3/uL (ref 3.4–10.8)

## 2021-06-20 LAB — HEMOGLOBIN A1C
Est. average glucose Bld gHb Est-mCnc: 117 mg/dL
Hgb A1c MFr Bld: 5.7 % — ABNORMAL HIGH (ref 4.8–5.6)

## 2021-06-20 LAB — TSH: TSH: 2.51 u[IU]/mL (ref 0.450–4.500)

## 2021-06-20 NOTE — Progress Notes (Signed)
TSH 2.5 normal, A1c 5.7 prediabetes, cbc ok, kidney and liver tests normal,  lp

## 2021-07-24 ENCOUNTER — Telehealth: Payer: Self-pay

## 2021-07-24 NOTE — Telephone Encounter (Signed)
Can schedule televisit if not having severe dyspnea/shortness of breath and oxygen levels normal -- any provider

## 2021-07-24 NOTE — Telephone Encounter (Signed)
Mother calling as pt tested positive for COVID on Sunday. Main complaint is cough and chest tightness. Pt had covid in 2020 and was hospitalized. Mother has been giving dimetap and tylenol for symptoms. No fever. Mother has also been coaching pt to spit out what is coughed up.   Recommendations?   Royce Macadamia, [place] 07/24/21 11:08 AM

## 2021-07-24 NOTE — Telephone Encounter (Signed)
Spoke with mother. Pt was tested at home. Mother has been checking oxygen levels, staying high 90s.   Lorita Officer, [place] 07/24/21 2:04 PM

## 2021-07-25 NOTE — Telephone Encounter (Signed)
Mother was going to treat symptoms.

## 2021-10-14 ENCOUNTER — Other Ambulatory Visit: Payer: Self-pay | Admitting: Legal Medicine

## 2021-11-03 ENCOUNTER — Other Ambulatory Visit: Payer: Self-pay | Admitting: Legal Medicine

## 2021-12-02 ENCOUNTER — Ambulatory Visit: Payer: Medicaid Other | Admitting: Legal Medicine

## 2021-12-02 ENCOUNTER — Encounter: Payer: Self-pay | Admitting: Legal Medicine

## 2021-12-02 VITALS — BP 126/60 | HR 98 | Temp 97.7°F | Resp 14 | Ht 59.0 in | Wt 156.0 lb

## 2021-12-02 DIAGNOSIS — F71 Moderate intellectual disabilities: Secondary | ICD-10-CM

## 2021-12-02 DIAGNOSIS — H540X55 Blindness right eye category 5, blindness left eye category 5: Secondary | ICD-10-CM

## 2021-12-02 DIAGNOSIS — G7100 Muscular dystrophy, unspecified: Secondary | ICD-10-CM | POA: Diagnosis not present

## 2021-12-02 DIAGNOSIS — R7303 Prediabetes: Secondary | ICD-10-CM | POA: Diagnosis not present

## 2021-12-02 NOTE — Progress Notes (Signed)
Subjective:  Patient ID: Tina Oliver, female    DOB: 1985/09/22  Age: 36 y.o. MRN: [id number]  Chief Complaint  Patient presents with   Muscular Dystrophy    Loss of Vision    HPI: chronic  Patient is here today to discuss to get a wheelchair and roller walker.   Patient has chronic muscular dystrophy.  She Wheel chair for transportation.  She has limed Adls with assistance for dressing and toileting.  Getting to table.  Washing  needs at least One plus assistance.  She is incontinent for urine and stool.  IADLS - unable to prepare meals, do housework, use telephone, pay bills and writing.  Dependent fr all IADLS.  Her present wheel chair is rusted and needs replacement.  She is permanently blind and was seeing [facility]. She has inability to walk due to MD. And spasticity. She has severe scoliosis.  Current Outpatient Medications on File Prior to Visit  Medication Sig Dispense Refill   nystatin cream (MYCOSTATIN) APPLY TOPICALLY TO THE AFFECTED AREA TWICE DAILY 30 g 3   XULANE 150-35 MCG/24HR transdermal patch UNWRAP AND APPLY 1 PATCH TO BUTTOCK OR ABDOMEN EVERY WEEK 1 patch 1   No current facility-administered medications on file prior to visit.   Past Medical History:  Diagnosis Date   Cerebral palsy (HCC)    Muscular dystrophy (HCC)    Scoliosis    Total blindness    both eyes   History reviewed. No pertinent surgical history.  History reviewed. No pertinent family history. Social History   Socioeconomic History   Marital status: Single    Spouse name: Not on file   Number of children: Not on file   Years of education: Not on file   Highest education level: Not on file  Occupational History   Not on file  Tobacco Use   Smoking status: Never   Smokeless tobacco: Never  Substance and Sexual Activity   Alcohol use: Never   Drug use: Never   Sexual activity: Not on file  Other Topics Concern   Not on file  Social History Narrative   Not on file   Social  Determinants of Health   Financial Resource Strain: Not on file  Food Insecurity: Not on file  Transportation Needs: Not on file  Physical Activity: Not on file  Stress: Not on file  Social Connections: Not on file    Review of Systems  Constitutional:  Negative for chills, fatigue and fever.  HENT:  Negative for congestion, ear pain and sore throat.   Eyes:  Negative for visual disturbance.  Respiratory:  Negative for cough and shortness of breath.   Cardiovascular:  Negative for chest pain and palpitations.  Gastrointestinal:  Negative for abdominal pain, constipation, diarrhea, nausea and vomiting.  Endocrine: Negative for polydipsia, polyphagia and polyuria.  Genitourinary:  Negative for difficulty urinating and dysuria.  Musculoskeletal:  Negative for arthralgias, back pain and myalgias.  Skin:  Negative for rash.  Neurological:  Negative for headaches.       Spasticity arms and legs.unable to see.  Hematological: Negative.   Psychiatric/Behavioral:  Negative for dysphoric mood. The patient is not nervous/anxious.     Objective:  BP (!) 160/100   Pulse 98   Temp 97.7 F (36.5 C)   Resp 14   Ht 4\' 11"  (1.499 m)   Wt 156 lb (70.8 kg)   SpO2 99%   BMI 31.51 kg/m      12/02/2021  3:14 PM 06/19/2021    9:17 AM 12/01/2018    1:23 PM  BP/Weight  Systolic BP 160 120 111  Diastolic BP 100 70 67  Wt. (Lbs) 156 154   BMI 31.51 kg/m2 27.28 kg/m2     Physical Exam Vitals reviewed.  Constitutional:      General: She is not in acute distress.    Appearance: Normal appearance.  HENT:     Head: Normocephalic.     Right Ear: Tympanic membrane normal.     Left Ear: Tympanic membrane normal.     Nose: Nose normal.     Mouth/Throat:     Mouth: Mucous membranes are moist.     Pharynx: Oropharynx is clear.  Eyes:     Comments: Cornea cloudy bilaterally  Cardiovascular:     Rate and Rhythm: Normal rate and regular rhythm.     Pulses: Normal pulses.     Heart sounds:  Normal heart sounds. No murmur heard.   No gallop.  Pulmonary:     Effort: Pulmonary effort is normal. No respiratory distress.     Breath sounds: Normal breath sounds. No wheezing.  Abdominal:     General: Abdomen is flat. Bowel sounds are normal. There is no distension.     Palpations: Abdomen is soft.     Tenderness: There is no abdominal tenderness.  Musculoskeletal:        General: Normal range of motion.     Cervical back: Normal range of motion.     Right lower leg: Edema present.     Left lower leg: Edema present.  Skin:    General: Skin is warm.     Capillary Refill: Capillary refill takes less than 2 seconds.  Neurological:     General: No focal deficit present.     Mental Status: She is alert. Mental status is at baseline.     Motor: Weakness present.     Gait: Gait abnormal.     Deep Tendon Reflexes: Reflexes abnormal.     Comments: Muscle spasms  Psychiatric:        Mood and Affect: Mood normal.        Thought Content: Thought content normal.        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  She also has intellectual disability.  Due to pain in her back and legs as well as muscle spasticity she is unable to be transported without a regular wheelchair.  She requires assistance for all ADLs and is unable to perform IADLs.  This wheelchair is required to maintain present level of  function and to prevent further deterioration of her condition.     Other   Blindness Patient has chronic blindness with clouding of both cornea she has been followed  up with [facility] for quite some time and now is doing locally.  She is legally blind.   Other Visit Diagnoses     Intellectual developmental disorder, moderate    -  Primary Patient has moderate intellectual developmental disorder related to her muscular dystrophy she can talk in a slurred speech and has moderate memory retention.  She has never gone to school.    Prediabetes     Last A1c was 5.7 she is on a regular diet at the present time we will recheck her A1c to see if she is developing diabetes.     .  A total of 40 minutes were spent face-to-face with the patient during this encounter and over half of that time was spent on counseling and coordination of care.  I had to examine her ability to do ADLs and IADLs she is also incontinent.     Follow-up: Return in about 6 months (around 06/04/2022).  An After Visit Summary was printed and given to the patient.  Brent Bulla, MD [facility] [phone]

## 2021-12-03 LAB — HEMOGLOBIN A1C
Est. average glucose Bld gHb Est-mCnc: 111 mg/dL
Hgb A1c MFr Bld: 5.5 % (ref 4.8–5.6)

## 2021-12-03 NOTE — Progress Notes (Signed)
A1c 5.5 normal °lp

## 2022-03-02 ENCOUNTER — Other Ambulatory Visit: Payer: Self-pay | Admitting: Legal Medicine

## 2022-04-15 ENCOUNTER — Other Ambulatory Visit: Payer: Self-pay | Admitting: Legal Medicine

## 2022-06-15 ENCOUNTER — Ambulatory Visit (INDEPENDENT_AMBULATORY_CARE_PROVIDER_SITE_OTHER): Payer: Medicaid Other | Admitting: Nurse Practitioner

## 2022-06-15 ENCOUNTER — Encounter: Payer: Self-pay | Admitting: Nurse Practitioner

## 2022-06-15 VITALS — BP 112/78 | HR 77 | Temp 97.5°F | Ht 59.0 in | Wt 150.0 lb

## 2022-06-15 DIAGNOSIS — R110 Nausea: Secondary | ICD-10-CM

## 2022-06-15 DIAGNOSIS — J018 Other acute sinusitis: Secondary | ICD-10-CM

## 2022-06-15 DIAGNOSIS — R051 Acute cough: Secondary | ICD-10-CM | POA: Diagnosis not present

## 2022-06-15 DIAGNOSIS — J019 Acute sinusitis, unspecified: Secondary | ICD-10-CM | POA: Insufficient documentation

## 2022-06-15 LAB — POC COVID19 BINAXNOW: SARS Coronavirus 2 Ag: NEGATIVE

## 2022-06-15 LAB — POCT INFLUENZA A/B
Influenza A, POC: NEGATIVE
Influenza B, POC: NEGATIVE

## 2022-06-15 MED ORDER — BENZONATATE 100 MG PO CAPS: 100.0000 mg | ORAL_CAPSULE | Freq: Two times a day (BID) | ORAL | 0 refills | Status: DC | PRN

## 2022-06-15 MED ORDER — ONDANSETRON HCL 4 MG PO TABS: 4.0000 mg | ORAL_TABLET | Freq: Three times a day (TID) | ORAL | 0 refills | Status: DC | PRN

## 2022-06-15 MED ORDER — AZITHROMYCIN 250 MG PO TABS: ORAL_TABLET | ORAL | 0 refills | Status: AC

## 2022-06-15 NOTE — Progress Notes (Addendum)
Acute Office Visit  Subjective:    Patient ID: Tina Oliver, female    DOB: 17-May-1986, 36 y.o.   MRN: [id number]  CC: URI  HPI: Patient is in today for Upper respiratory symptoms   accompanied with her mother who supplements history. Pt is blind and has muscular dystrophy. She complains of cough, sinus congestion, nausea, vomiting . Denies fever, chills, night sweats or weight loss. Onset of symptoms was 1 month ago. Taking over the counter dimetapp with no relief.   Past Medical History:  Diagnosis Date   Cerebral palsy ([place])    Muscular dystrophy ([place])    Scoliosis    Total blindness    both eyes    No past surgical history on file.  No family history on file.  Social History   Socioeconomic History   Marital status: Single    Spouse name: Not on file   Number of children: Not on file   Years of education: Not on file   Highest education level: Not on file  Occupational History   Not on file  Tobacco Use   Smoking status: Never   Smokeless tobacco: Never  Substance and Sexual Activity   Alcohol use: Never   Drug use: Never   Sexual activity: Not on file  Other Topics Concern   Not on file  Social History Narrative   Not on file   Social Determinants of Health   Financial Resource Strain: Not on file  Food Insecurity: Not on file  Transportation Needs: Not on file  Physical Activity: Not on file  Stress: Not on file  Social Connections: Not on file  Intimate Partner Violence: Not on file    Outpatient Medications Prior to Visit  Medication Sig Dispense Refill   nystatin cream (MYCOSTATIN) APPLY TOPICALLY TO THE AFFECTED AREA TWICE DAILY 30 g 3   XULANE 150-35 MCG/24HR transdermal patch UNWRAP AND APPLY 1 PATCH TO BUTTOCK OR ABDOMEN ONCE WEEKLY 3 patch 5   No facility-administered medications prior to visit.    Allergies  Allergen Reactions   Ceclor [Cefaclor] Nausea And Vomiting    Tolerates ceftriaxone fine   Amoxicillin Rash    Mild  rash as a young child. Unknown cephalosporin history/not tried    Review of Systems  Constitutional:  Positive for fatigue.  HENT:  Positive for congestion, sinus pressure and sinus pain.   Respiratory:  Positive for cough.   Gastrointestinal:  Positive for nausea and vomiting.      Objective:    Physical Exam HENT:     Head: Normocephalic.     Right Ear: Tympanic membrane is scarred (lot of scar tissue compared to left).     Left Ear: Tympanic membrane is scarred.     Nose: Congestion and rhinorrhea present.     Mouth/Throat:     Mouth: Mucous membranes are moist.     Pharynx: No oropharyngeal exudate or posterior oropharyngeal erythema.  Cardiovascular:     Rate and Rhythm: Normal rate and regular rhythm.     Pulses: Normal pulses.  Pulmonary:     Breath sounds: Normal breath sounds.  Neurological:     Mental Status: She is alert and oriented to person, place, and time.   BP 112/78   Pulse 77   Temp (!) 97.5 F (36.4 C)   Ht _0  (1.499 m)   Wt 150 lb (68 kg)   SpO2 99%   BMI 30.30 kg/m   Wt Readings from Last  3 Encounters:  12/02/21 156 lb (70.8 kg)  06/19/21 154 lb (69.9 kg)  11/26/18 150 lb 2.1 oz (68.1 kg)    Health Maintenance Due  Topic Date Due   COVID-19 Vaccine (1) Never done   DTaP/Tdap/Td (1 - Tdap) Never done   PAP SMEAR-Modifier  Never done   INFLUENZA VACCINE  Never done    Lab Results  Component Value Date   TSH 2.510 06/19/2021   Lab Results  Component Value Date   WBC 5.3 06/19/2021   HGB 16.1 (H) 06/19/2021   HCT 48.9 (H) 06/19/2021   MCV 82 06/19/2021   PLT 244 06/19/2021   Lab Results  Component Value Date   NA 140 06/19/2021   K 4.5 06/19/2021   CO2 18 (L) 06/19/2021   GLUCOSE 76 06/19/2021   BUN 9 06/19/2021   CREATININE 0.74 06/19/2021   BILITOT <0.2 06/19/2021   ALKPHOS 90 06/19/2021   AST 22 06/19/2021   ALT 13 06/19/2021   PROT 7.9 06/19/2021   ALBUMIN 4.3 06/19/2021   CALCIUM 10.4 (H) 06/19/2021   ANIONGAP 9  11/30/2018   EGFR 109 06/19/2021    Lab Results  Component Value Date   HGBA1C 5.5 12/02/2021       Assessment & Plan:    1. Acute non-recurrent sinusitis of other sinus - azithromycin (ZITHROMAX) 250 MG tablet; Take 2 tablets on day 1, then 1 tablet daily on days 2 through 5  Dispense: 6 tablet; Refill: 0  2. Acute cough - POC COVID-19 BinaxNow-NEGATIVE - POCT Influenza A/B-NEGATIVE - benzonatate (TESSALON) 100 MG capsule; Take 1 capsule (100 mg total) by mouth 2 (two) times daily as needed for cough.  Dispense: 20 capsule; Refill: 0  3. Nausea - ondansetron (ZOFRAN) 4 MG tablet; Take 1 tablet (4 mg total) by mouth every 8 (eight) hours as needed for nausea or vomiting.  Dispense: 20 tablet; Refill: 0    Take antibiotics as prescribed Use Flonase nasal spray daily Take Promethazine-DM up to 4 times daily for cough/sinus congestion Follow-up as needed   Follow-up:  As needed  I, Rip Harbour, NP, have reviewed all documentation for this visit. The documentation on 06/15/22 for the exam, diagnosis, procedures, and orders are all accurate and complete.  An After Visit Summary was printed and given to the patient.  Rip Harbour, NP [place] [phone]

## 2022-06-15 NOTE — Patient Instructions (Signed)
Take antibiotics as prescribed Use Flonase nasal spray daily Take Promethazine-DM up to 4 times daily for cough/sinus congestion Follow-up as needed    Sinus Infection, Adult A sinus infection is soreness and swelling (inflammation) of your sinuses. Sinuses are hollow spaces in the bones around your face. They are located: Around your eyes. In the middle of your forehead. Behind your nose. In your cheekbones. Your sinuses and nasal passages are lined with a fluid called mucus. Mucus drains out of your sinuses. Swelling can trap mucus in your sinuses. This lets germs (bacteria, virus, or fungus) grow, which leads to infection. Most of the time, this condition is caused by a virus. What are the causes? Allergies. Asthma. Germs. Things that block your nose or sinuses. Growths in the nose (nasal polyps). Chemicals or irritants in the air. A fungus. This is rare. What increases the risk? Having a weak body defense system (immune system). Doing a lot of swimming or diving. Using nasal sprays too much. Smoking. What are the signs or symptoms? The main symptoms of this condition are pain and a feeling of pressure around the sinuses. Other symptoms include: Stuffy nose (congestion). This may make it hard to breathe through your nose. Runny nose (drainage). Soreness, swelling, and warmth in the sinuses. A cough that may get worse at night. Being unable to smell and taste. Mucus that collects in the throat or the back of the nose (postnasal drip). This may cause a sore throat or bad breath. Being very tired (fatigued). A fever. How is this diagnosed? Your symptoms. Your medical history. A physical exam. Tests to find out if your condition is short-term (acute) or long-term (chronic). Your doctor may: Check your nose for growths (polyps). Check your sinuses using a tool that has a light on one end (endoscope). Check for allergies or germs. Do imaging tests, such as an MRI or CT  scan. How is this treated? Treatment for this condition depends on the cause and whether it is short-term or long-term. If caused by a virus, your symptoms should go away on their own within 10 days. You may be given medicines to relieve symptoms. They include: Medicines that shrink swollen tissue in the nose. A spray that treats swelling of the nostrils. Rinses that help get rid of thick mucus in your nose (nasal saline washes). Medicines that treat allergies (antihistamines). Over-the-counter pain relievers. If caused by bacteria, your doctor may wait to see if you will get better without treatment. You may be given antibiotic medicine if you have: A very bad infection. A weak body defense system. If caused by growths in the nose, surgery may be needed. Follow these instructions at home: Medicines Take, use, or apply over-the-counter and prescription medicines only as told by your doctor. These may include nasal sprays. If you were prescribed an antibiotic medicine, take it as told by your doctor. Do not stop taking it even if you start to feel better. Hydrate and humidify  Drink enough water to keep your pee (urine) pale yellow. Use a cool mist humidifier to keep the humidity level in your home above 50%. Breathe in steam for 10-15 minutes, 3-4 times a day, or as told by your doctor. You can do this in the bathroom while a hot shower is running. Try not to spend time in cool or dry air. Rest Rest as much as you can. Sleep with your head raised (elevated). Make sure you get enough sleep each night. General instructions  Put a   warm, moist washcloth on your face 3-4 times a day, or as often as told by your doctor. Use nasal saline washes as often as told by your doctor. Wash your hands often with soap and water. If you cannot use soap and water, use hand sanitizer. Do not smoke. Avoid being around people who are smoking (secondhand smoke). Keep all follow-up visits. Contact a doctor  if: You have a fever. Your symptoms get worse. Your symptoms do not get better within 10 days. Get help right away if: You have a very bad headache. You cannot stop vomiting. You have very bad pain or swelling around your face or eyes. You have trouble seeing. You feel confused. Your neck is stiff. You have trouble breathing. These symptoms may be an emergency. Get help right away. Call 911. Do not wait to see if the symptoms will go away. Do not drive yourself to the hospital. Summary A sinus infection is swelling of your sinuses. Sinuses are hollow spaces in the bones around your face. This condition is caused by tissues in your nose that become inflamed or swollen. This traps germs. These can lead to infection. If you were prescribed an antibiotic medicine, take it as told by your doctor. Do not stop taking it even if you start to feel better. Keep all follow-up visits. This information is not intended to replace advice given to you by your health care provider. Make sure you discuss any questions you have with your health care provider. Document Revised: 06/03/2021 Document Reviewed: 06/03/2021 Elsevier Patient Education  2023 Elsevier Inc.  

## 2022-06-22 ENCOUNTER — Telehealth: Payer: Self-pay

## 2022-06-22 ENCOUNTER — Other Ambulatory Visit: Payer: Self-pay | Admitting: Nurse Practitioner

## 2022-06-22 DIAGNOSIS — J018 Other acute sinusitis: Secondary | ICD-10-CM

## 2022-06-22 MED ORDER — DOXYCYCLINE HYCLATE 100 MG PO TABS: 100.0000 mg | ORAL_TABLET | Freq: Two times a day (BID) | ORAL | 0 refills | Status: DC

## 2022-06-22 NOTE — Telephone Encounter (Signed)
Patient was seen on 06/15/2022 and they gave her Zpack and tessalon pearls. She finished antibiotic, but her mother said that she still the same. She asked if can you send a new antibiotics. Please advice.

## 2022-06-22 NOTE — Telephone Encounter (Signed)
Patients mother was informed

## 2022-06-23 ENCOUNTER — Telehealth: Payer: Self-pay

## 2022-06-23 NOTE — Telephone Encounter (Signed)
Needs to be seen lp

## 2022-06-23 NOTE — Telephone Encounter (Signed)
Mom Tina Oliver) wanted to know if you could send a rx in for her daughter a secretion suction because the antibiotic she's on is making her cough and she can't cough up on her own.  Please advise

## 2022-08-04 ENCOUNTER — Telehealth: Payer: Self-pay

## 2022-08-04 NOTE — Telephone Encounter (Signed)
Patient called stating that her daughter tested positive this morning for covid with a home test. She has a scratchy throat and nasal congestion with no fever. Mom wants to know if she needs the medication for covid?  She is concerned because of her health issues.  Please advise

## 2022-08-04 NOTE — Telephone Encounter (Signed)
Patient mom made aware, verbalized Understanding.

## 2022-08-05 ENCOUNTER — Encounter: Payer: Self-pay | Admitting: Physician Assistant

## 2022-08-05 ENCOUNTER — Telehealth (INDEPENDENT_AMBULATORY_CARE_PROVIDER_SITE_OTHER): Payer: Medicaid Other | Admitting: Physician Assistant

## 2022-08-05 VITALS — BP 149/89 | HR 75 | Temp 98.1°F | Ht 59.0 in | Wt 145.0 lb

## 2022-08-05 DIAGNOSIS — U071 COVID-19: Secondary | ICD-10-CM

## 2022-08-05 MED ORDER — PROMETHAZINE-DM 6.25-15 MG/5ML PO SYRP: 5.0000 mL | ORAL_SOLUTION | Freq: Four times a day (QID) | ORAL | 0 refills | Status: DC | PRN

## 2022-08-05 NOTE — Progress Notes (Signed)
Virtual Visit via Telephone Note   This visit type was conducted due to national recommendations for restrictions regarding the COVID-19 Pandemic (e.g. social distancing) in an effort to limit this patient's exposure and mitigate transmission in our community.  Due to her co-morbid illnesses, this patient is at least at moderate risk for complications without adequate follow up.  This format is felt to be most appropriate for this patient at this time.  The patient did not have access to video technology/had technical difficulties with video requiring transitioning to audio format only (telephone).  All issues noted in this document were discussed and addressed.  No physical exam could be performed with this format.  Patient verbally consented to a telehealth visit.   Date:  08/05/2022   ID:  Tina Oliver, DOB Sep 07, 1985, MRN [id number]  Patient Location: Home Provider Location: Office  PCP:  Lillard Anes, MD (Inactive)     Chief Complaint:  COVID  History of Present Illness:    Tina Oliver is a 37 y.o. female with complaints of cough, congestion and fever since Monday - has had body aches as well.  Father with COVID and she tested positive yesterday  The patient does have symptoms concerning for COVID-19 infection (fever, chills, cough, or new shortness of breath).    Past Medical History:  Diagnosis Date   Cerebral palsy ([place])    Muscular dystrophy ([place])    Scoliosis    Total blindness    both eyes   History reviewed. No pertinent surgical history.   Current Meds  Medication Sig   nystatin cream (MYCOSTATIN) APPLY TOPICALLY TO THE AFFECTED AREA TWICE DAILY   ondansetron (ZOFRAN) 4 MG tablet Take 1 tablet (4 mg total) by mouth every 8 (eight) hours as needed for nausea or vomiting.   promethazine-dextromethorphan (PROMETHAZINE-DM) 6.25-15 MG/5ML syrup Take 5 mLs by mouth 4 (four) times daily as needed.   XULANE 150-35 MCG/24HR transdermal patch UNWRAP AND  APPLY 1 PATCH TO BUTTOCK OR ABDOMEN ONCE WEEKLY     Allergies:   Ceclor [cefaclor] and Amoxicillin   Social History   Tobacco Use   Smoking status: Never   Smokeless tobacco: Never  Substance Use Topics   Alcohol use: Never   Drug use: Never     Family Hx: The patient's family history is not on file.  ROS:   Please see the history of present illness.    All other systems reviewed and are negative.  Labs/Other Tests and Data Reviewed:    Recent Labs: No results found for requested labs within last 365 days.   Recent Lipid Panel No results found for: "CHOL", "TRIG", "HDL", "CHOLHDL", "LDLCALC", "LDLDIRECT"  Wt Readings from Last 3 Encounters:  08/05/22 145 lb (65.8 kg)  06/15/22 150 lb (68 kg)  12/02/21 156 lb (70.8 kg)     Objective:    Vital Signs:  BP (!) 149/89   Pulse 75   Temp 98.1 F (36.7 C)   Ht 4\' 11"  (1.499 m)   Wt 145 lb (65.8 kg)   SpO2 95%   BMI 29.29 kg/m    VITAL SIGNS:  reviewed GEN:  no acute distress  ASSESSMENT & PLAN:    COVID 19- recommend rest, fluids and tylenol No GFR  on file - cannot prescribe paxlovid and mother would like to treat symptoms instead of trying molnupiravir Rx for phenergan/DM - take as directed  COVID-19 Education: The signs and symptoms of COVID-19 were discussed with the  patient and how to seek care for testing (follow up with PCP or arrange E-visit). The importance of social distancing was discussed today.  Time:   Today, I have spent 10 minutes with the patient with telehealth technology discussing the above problems.     Medication Adjustments/Labs and Tests Ordered: Current medicines are reviewed at length with the patient today.  Concerns regarding medicines are outlined above.   Tests Ordered: No orders of the defined types were placed in this encounter.   Medication Changes: Meds ordered this encounter  Medications   promethazine-dextromethorphan (PROMETHAZINE-DM) 6.25-15 MG/5ML syrup    Sig:  Take 5 mLs by mouth 4 (four) times daily as needed.    Dispense:  118 mL    Refill:  0    Order Specific Question:   Supervising Provider    AnswerShelton Silvas    Follow Up:  In Person prn - also recommend to schedule wellness visit/labwork  Signed, Yetta Flock  08/05/2022 8:39 AM    [place]

## 2022-10-29 ENCOUNTER — Encounter: Payer: Self-pay | Admitting: Family Medicine

## 2022-10-29 ENCOUNTER — Ambulatory Visit (INDEPENDENT_AMBULATORY_CARE_PROVIDER_SITE_OTHER): Payer: Medicaid Other | Admitting: Family Medicine

## 2022-10-29 VITALS — BP 136/80 | HR 65 | Temp 97.4°F | Ht 59.0 in | Wt 145.0 lb

## 2022-10-29 DIAGNOSIS — G7100 Muscular dystrophy, unspecified: Secondary | ICD-10-CM | POA: Diagnosis not present

## 2022-10-29 DIAGNOSIS — G800 Spastic quadriplegic cerebral palsy: Secondary | ICD-10-CM

## 2022-10-29 DIAGNOSIS — H540X55 Blindness right eye category 5, blindness left eye category 5: Secondary | ICD-10-CM | POA: Diagnosis not present

## 2022-10-29 DIAGNOSIS — M533 Sacrococcygeal disorders, not elsewhere classified: Secondary | ICD-10-CM

## 2022-10-29 DIAGNOSIS — R7303 Prediabetes: Secondary | ICD-10-CM | POA: Diagnosis not present

## 2022-10-29 DIAGNOSIS — M4145 Neuromuscular scoliosis, thoracolumbar region: Secondary | ICD-10-CM

## 2022-10-29 DIAGNOSIS — M419 Scoliosis, unspecified: Secondary | ICD-10-CM | POA: Insufficient documentation

## 2022-10-29 DIAGNOSIS — F71 Moderate intellectual disabilities: Secondary | ICD-10-CM | POA: Diagnosis not present

## 2022-10-29 DIAGNOSIS — G809 Cerebral palsy, unspecified: Secondary | ICD-10-CM | POA: Insufficient documentation

## 2022-10-29 NOTE — Progress Notes (Unsigned)
Subjective:  Patient ID: Tina Oliver, female    DOB: 25-Mar-1986  Age: 37 y.o. MRN: [id number]  Chief Complaint  Patient presents with   6 month follow up    HPI   Patient has chronic muscular dystrophy and cerebral palsy.  She uses wheel chair for transportation.  She has limited Adls with assistance for dressing and toileting.  Getting to table.  Washing  needs at least One plus assistance.  She is incontinent for urine and stool. Wears Large depends, changes depends 6-7 times a day (56 times a month)also uses bed pads/chux changes 3-4 times a day IADLS - unable to prepare meals, do housework, use telephone, pay bills and writing.  Dependent for all IADLS.  She is permanently blind and was seeing [facility]. She has inability to walk due to MD. And spasticity. She has severe scoliosis.      10/29/2022   11:27 AM 06/19/2021    9:23 AM  Depression screen PHQ 2/9  Decreased Interest 0 0  Down, Depressed, Hopeless 0 0  PHQ - 2 Score 0 0         11/29/2018    8:00 PM 11/30/2018    8:11 AM 11/30/2018    9:00 PM 12/01/2018    9:00 AM 10/29/2022   11:27 AM  Fall Risk  Falls in the past year?     1  Was there an injury with Fall?     1  Fall Risk Category Calculator     3  (RETIRED) Patient Fall Risk Level High fall risk High fall risk High fall risk High fall risk   Patient at Risk for Falls Due to     Impaired balance/gait;Impaired mobility;Impaired vision;History of fall(s)  Fall risk Follow up     Falls prevention discussed;Falls evaluation completed      Review of Systems  Constitutional:  Negative for chills, fatigue and fever.  HENT:  Negative for congestion, ear pain, rhinorrhea and sore throat.   Respiratory:  Negative for cough and shortness of breath.   Cardiovascular:  Negative for chest pain.  Gastrointestinal:  Negative for abdominal pain, constipation, diarrhea, nausea and vomiting.  Genitourinary:  Negative for dysuria and urgency.  Musculoskeletal:  Negative for  back pain and myalgias.  Neurological:  Negative for dizziness, weakness, light-headedness and headaches.  Psychiatric/Behavioral:  Negative for dysphoric mood. The patient is not nervous/anxious.     Current Outpatient Medications on File Prior to Visit  Medication Sig Dispense Refill   nystatin cream (MYCOSTATIN) APPLY TOPICALLY TO THE AFFECTED AREA TWICE DAILY 30 g 3   ondansetron (ZOFRAN) 4 MG tablet Take 1 tablet (4 mg total) by mouth every 8 (eight) hours as needed for nausea or vomiting. 20 tablet 0   XULANE 150-35 MCG/24HR transdermal patch UNWRAP AND APPLY 1 PATCH TO BUTTOCK OR ABDOMEN ONCE WEEKLY 3 patch 5   No current facility-administered medications on file prior to visit.   Past Medical History:  Diagnosis Date   Cerebral palsy    Muscular dystrophy    Scoliosis    Total blindness    both eyes   History reviewed. No pertinent surgical history.  Family History  Adopted: Yes   Social History   Socioeconomic History   Marital status: Single    Spouse name: Not on file   Number of children: Not on file   Years of education: Not on file   Highest education level: Not on file  Occupational History  Not on file  Tobacco Use   Smoking status: Never   Smokeless tobacco: Never  Vaping Use   Vaping Use: Never used  Substance and Sexual Activity   Alcohol use: Never   Drug use: Never   Sexual activity: Never  Other Topics Concern   Not on file  Social History Narrative   Not on file   Social Determinants of Health   Financial Resource Strain: Low Risk  (10/29/2022)   Overall Financial Resource Strain (CARDIA)    Difficulty of Paying Living Expenses: Not hard at all  Food Insecurity: No Food Insecurity (10/29/2022)   Hunger Vital Sign    Worried About Running Out of Food in the Last Year: Never true    Ran Out of Food in the Last Year: Never true  Transportation Needs: No Transportation Needs (10/29/2022)   PRAPARE - Administrator, Civil Service  (Medical): No    Lack of Transportation (Non-Medical): No  Physical Activity: Inactive (10/29/2022)   Exercise Vital Sign    Days of Exercise per Week: 0 days    Minutes of Exercise per Session: 0 min  Stress: No Stress Concern Present (10/29/2022)   [employer] of Occupational Health - Occupational Stress Questionnaire    Feeling of Stress : Not at all  Social Connections: Moderately Isolated (10/29/2022)   Social Connection and Isolation Panel [NHANES]    Frequency of Communication with Friends and Family: More than three times a week    Frequency of Social Gatherings with Friends and Family: More than three times a week    Attends Religious Services: More than 4 times per year    Active Member of [employer] or Organizations: No    Attends Engineer, structural: Never    Marital Status: Never married    Objective:  BP 136/80   Pulse 65   Temp (!) 97.4 F (36.3 C)   Ht 4\' 11"  (1.499 m)   Wt 145 lb (65.8 kg)   SpO2 97%   BMI 29.29 kg/m      10/29/2022   11:24 AM 08/05/2022    7:59 AM 06/15/2022    3:46 PM  BP/Weight  Systolic BP 136 149 112  Diastolic BP 80 89 78  Wt. (Lbs) 145 145 150  BMI 29.29 kg/m2 29.29 kg/m2 30.3 kg/m2    Physical Exam Vitals reviewed.  Neck:     Vascular: No carotid bruit.  Cardiovascular:     Rate and Rhythm: Normal rate and regular rhythm.     Heart sounds: Normal heart sounds.  Pulmonary:     Effort: Pulmonary effort is normal. No respiratory distress.     Breath sounds: Normal breath sounds.  Abdominal:     General: Abdomen is flat. Bowel sounds are normal.     Palpations: Abdomen is soft.     Tenderness: There is no abdominal tenderness.  Musculoskeletal:        General: Tenderness (sacrum) present.  Neurological:     Mental Status: She is alert.     Gait: Gait abnormal (spasticity of legs and arms.).  Psychiatric:        Mood and Affect: Mood normal.        Behavior: Behavior normal.     Diabetic Foot Exam - Simple    No data filed      Lab Results  Component Value Date   WBC 5.3 06/19/2021   HGB 16.1 (H) 06/19/2021   HCT 48.9 (H) 06/19/2021  PLT 244 06/19/2021   GLUCOSE 76 06/19/2021   ALT 13 06/19/2021   AST 22 06/19/2021   NA 140 06/19/2021   K 4.5 06/19/2021   CL 102 06/19/2021   CREATININE 0.74 06/19/2021   BUN 9 06/19/2021   CO2 18 (L) 06/19/2021   TSH 2.510 06/19/2021   HGBA1C 5.5 12/02/2021      Assessment & Plan:    Muscular dystrophy Assessment & Plan: Requires assistance with care.    Prediabetes Assessment & Plan: Recommend continue to work on eating healthy diet and exercise.  Check labs.  Orders: -     CBC with Differential/Platelet -     Comprehensive metabolic panel -     Lipid panel -     Hemoglobin A1c  Category 5 blindness of both eyes Assessment & Plan: Complete.   Intellectual developmental disorder, moderate Assessment & Plan: Check labs. Requires assistance with care.   Orders: -     TSH  Sacral pain Assessment & Plan: Refer to Orthopedics  Orders: -     Ambulatory referral to Orthopedics  Neuromuscular scoliosis of thoracolumbar region Assessment & Plan: Refer to orthopedics.   Orders: -     Ambulatory referral to Orthopedics -     VITAMIN D 25 Hydroxy (Vit-D Deficiency, Fractures)  Spastic quadriplegic cerebral palsy Assessment & Plan: Stable.       No orders of the defined types were placed in this encounter.   Orders Placed This Encounter  Procedures   CBC with Differential/Platelet   Comprehensive metabolic panel   Lipid panel   Hemoglobin A1c   TSH   VITAMIN D 25 Hydroxy (Vit-D Deficiency, Fractures)   Ambulatory referral to Orthopedics     Follow-up: Return in about 6 months (around 04/30/2023).   I,Katherina A Bramblett,acting as a scribe for Blane Ohara, MD.,have documented all relevant documentation on the behalf of Blane Ohara, MD,as directed by  Blane Ohara, MD while in the presence of Blane Ohara,  MD.    An After Visit Summary was printed and given to the patient.  I attest that I have reviewed this visit and agree with the plan scribed by my staff.   Blane Ohara, MD [facility] [phone]

## 2022-10-31 DIAGNOSIS — F71 Moderate intellectual disabilities: Secondary | ICD-10-CM | POA: Insufficient documentation

## 2022-10-31 DIAGNOSIS — R7303 Prediabetes: Secondary | ICD-10-CM | POA: Insufficient documentation

## 2022-10-31 DIAGNOSIS — M533 Sacrococcygeal disorders, not elsewhere classified: Secondary | ICD-10-CM | POA: Insufficient documentation

## 2022-10-31 NOTE — Assessment & Plan Note (Signed)
Recommend continue to work on eating healthy diet and exercise. ?Check labs. ?

## 2022-10-31 NOTE — Assessment & Plan Note (Signed)
Refer to Orthopedics.

## 2022-10-31 NOTE — Assessment & Plan Note (Signed)
Check labs. Requires assistance with care.

## 2022-11-01 NOTE — Assessment & Plan Note (Signed)
Refer to orthopedics 

## 2022-11-01 NOTE — Assessment & Plan Note (Signed)
Requires assistance with care.

## 2022-11-01 NOTE — Assessment & Plan Note (Signed)
Stable

## 2022-11-01 NOTE — Assessment & Plan Note (Signed)
Complete

## 2022-11-02 LAB — CBC WITH DIFFERENTIAL/PLATELET
Basophils Absolute: 0 10*3/uL (ref 0.0–0.2)
Basos: 1 %
EOS (ABSOLUTE): 0.1 10*3/uL (ref 0.0–0.4)
Eos: 2 %
Hematocrit: 46.2 % (ref 34.0–46.6)
Hemoglobin: 14.9 g/dL (ref 11.1–15.9)
Immature Grans (Abs): 0 10*3/uL (ref 0.0–0.1)
Immature Granulocytes: 0 %
Lymphocytes Absolute: 2.9 10*3/uL (ref 0.7–3.1)
Lymphs: 47 %
MCH: 27.6 pg (ref 26.6–33.0)
MCHC: 32.3 g/dL (ref 31.5–35.7)
MCV: 86 fL (ref 79–97)
Monocytes Absolute: 0.3 10*3/uL (ref 0.1–0.9)
Monocytes: 5 %
Neutrophils Absolute: 2.7 10*3/uL (ref 1.4–7.0)
Neutrophils: 45 %
Platelets: 219 10*3/uL (ref 150–450)
RBC: 5.39 x10E6/uL — ABNORMAL HIGH (ref 3.77–5.28)
RDW: 13.7 % (ref 11.7–15.4)
WBC: 6.1 10*3/uL (ref 3.4–10.8)

## 2022-11-02 LAB — LIPID PANEL

## 2022-11-02 LAB — COMPREHENSIVE METABOLIC PANEL

## 2022-11-02 LAB — CARDIOVASCULAR RISK ASSESSMENT

## 2022-11-02 LAB — HEMOGLOBIN A1C
Est. average glucose Bld gHb Est-mCnc: 123 mg/dL
Hgb A1c MFr Bld: 5.9 % — ABNORMAL HIGH (ref 4.8–5.6)

## 2022-11-02 LAB — TSH

## 2022-11-02 LAB — VITAMIN D 25 HYDROXY (VIT D DEFICIENCY, FRACTURES): Vit D, 25-Hydroxy: 16.3 ng/mL — ABNORMAL LOW (ref 30.0–100.0)

## 2022-11-02 NOTE — Progress Notes (Signed)
Cbc normal.  A1c consistent with prediabetes. Limit sugar.  Vitamin D low. Recommend vitamin D 50K weekly.  Some labs were unable to be run. Will consider at next visit.  Follow up in 6 months fasting.

## 2022-11-04 ENCOUNTER — Other Ambulatory Visit: Payer: Self-pay

## 2022-11-04 MED ORDER — VITAMIN D (ERGOCALCIFEROL) 1.25 MG (50000 UNIT) PO CAPS: 50000.0000 [IU] | ORAL_CAPSULE | ORAL | 1 refills | Status: DC

## 2022-11-18 ENCOUNTER — Telehealth: Payer: Self-pay

## 2022-11-18 NOTE — Telephone Encounter (Signed)
Velna Hatchet called stating the Spine and Scoliosis office informed her that prior to patient being able to make an appointment that she will need a MRI with in the past 12 months.

## 2022-11-19 ENCOUNTER — Other Ambulatory Visit: Payer: Self-pay | Admitting: Family Medicine

## 2022-11-19 DIAGNOSIS — M545 Low back pain, unspecified: Secondary | ICD-10-CM

## 2022-11-19 DIAGNOSIS — M4146 Neuromuscular scoliosis, lumbar region: Secondary | ICD-10-CM

## 2022-11-19 NOTE — Telephone Encounter (Signed)
Ordered. Dr Aileana Hodder  

## 2022-11-25 ENCOUNTER — Telehealth: Payer: Self-pay

## 2022-11-25 NOTE — Telephone Encounter (Signed)
   Tina Oliver has been scheduled for the following appointment:  WHAT: MRI WHERE: [place] DATE: 11/25/22 TIME: 2:45  Patient has been made aware.

## 2022-12-01 ENCOUNTER — Encounter: Payer: Self-pay | Admitting: Family Medicine

## 2022-12-02 ENCOUNTER — Telehealth: Payer: Self-pay

## 2022-12-02 NOTE — Telephone Encounter (Signed)
Patient's mother is calling to inquire about patient's MRI lumbar spine results. I see where they were scanned in but I can't tell if they were routed to you or not? She also states we were supposed to send a copy of it to the spine specialist that we were referring her to which was Dr. Noel Gerold? Because they needed the results to view if they were going to take her on as a patient and be seen?

## 2022-12-03 NOTE — Telephone Encounter (Signed)
Patient's mother Made Aware, Verbalized Understanding.

## 2022-12-03 NOTE — Telephone Encounter (Signed)
Very small disc protrusion at 2 levels in the lumbar spine.  Unclear if this is truly causing the issue. I faxed to Dr. Wells Guiles office, but I believe they will want the pt to pick up a disc at [facility] of the actual mri images. Tina Oliver kc

## 2022-12-24 ENCOUNTER — Other Ambulatory Visit: Payer: Self-pay

## 2022-12-24 MED ORDER — NYSTATIN 100000 UNIT/GM EX CREA: TOPICAL_CREAM | CUTANEOUS | 0 refills | Status: DC

## 2022-12-31 ENCOUNTER — Telehealth: Payer: Self-pay | Admitting: Family Medicine

## 2022-12-31 NOTE — Telephone Encounter (Signed)
[place] [facility] REQUEST - WHEELCHAIR

## 2023-01-28 ENCOUNTER — Other Ambulatory Visit: Payer: Self-pay | Admitting: Family Medicine

## 2023-03-01 ENCOUNTER — Other Ambulatory Visit: Payer: Self-pay | Admitting: Family Medicine

## 2023-03-18 ENCOUNTER — Ambulatory Visit: Payer: MEDICAID | Admitting: Family Medicine

## 2023-03-18 VITALS — BP 110/76 | HR 68 | Temp 95.6°F | Resp 14 | Ht 59.0 in | Wt 140.0 lb

## 2023-03-18 DIAGNOSIS — R051 Acute cough: Secondary | ICD-10-CM

## 2023-03-18 DIAGNOSIS — J029 Acute pharyngitis, unspecified: Secondary | ICD-10-CM | POA: Insufficient documentation

## 2023-03-18 DIAGNOSIS — J208 Acute bronchitis due to other specified organisms: Secondary | ICD-10-CM | POA: Diagnosis not present

## 2023-03-18 DIAGNOSIS — H65192 Other acute nonsuppurative otitis media, left ear: Secondary | ICD-10-CM | POA: Diagnosis not present

## 2023-03-18 DIAGNOSIS — H6690 Otitis media, unspecified, unspecified ear: Secondary | ICD-10-CM | POA: Insufficient documentation

## 2023-03-18 LAB — POCT RAPID STREP A (OFFICE): Rapid Strep A Screen: NEGATIVE

## 2023-03-18 LAB — POC COVID19 BINAXNOW: SARS Coronavirus 2 Ag: NEGATIVE

## 2023-03-18 MED ORDER — AZITHROMYCIN 250 MG PO TABS: ORAL_TABLET | ORAL | 0 refills | Status: AC

## 2023-03-18 NOTE — Assessment & Plan Note (Signed)
Zpack given.  Continue robitussin

## 2023-03-18 NOTE — Progress Notes (Signed)
Acute Office Visit  Subjective:    Patient ID: Tina Oliver, female    DOB: [birth year], 37 y.o.   MRN: [id number]  Chief Complaint  Patient presents with   Sore Throat   Generalized Body Aches   Cough    HPI: Patient is in today for sore throat, body aches and cough which have been going on for about two weeks.    Past Medical History:  Diagnosis Date   Cerebral palsy (HCC)    Muscular dystrophy (HCC)    Scoliosis    Total blindness    both eyes    No past surgical history on file.  Family History  Adopted: Yes    Social History   Socioeconomic History   Marital status: Single    Spouse name: Not on file   Number of children: Not on file   Years of education: Not on file   Highest education level: Not on file  Occupational History   Not on file  Tobacco Use   Smoking status: Never   Smokeless tobacco: Never  Vaping Use   Vaping status: Never Used  Substance and Sexual Activity   Alcohol use: Never   Drug use: Never   Sexual activity: Never  Other Topics Concern   Not on file  Social History Narrative   Not on file   Social Determinants of Health   Financial Resource Strain: Low Risk  (10/29/2022)   Overall Financial Resource Strain (CARDIA)    Difficulty of Paying Living Expenses: Not hard at all  Food Insecurity: No Food Insecurity (10/29/2022)   Hunger Vital Sign    Worried About Running Out of Food in the Last Year: Never true    Ran Out of Food in the Last Year: Never true  Transportation Needs: No Transportation Needs (10/29/2022)   PRAPARE - Administrator, Civil Service (Medical): No    Lack of Transportation (Non-Medical): No  Physical Activity: Inactive (10/29/2022)   Exercise Vital Sign    Days of Exercise per Week: 0 days    Minutes of Exercise per Session: 0 min  Stress: No Stress Concern Present (10/29/2022)   [employer] of Occupational Health - Occupational Stress Questionnaire    Feeling of Stress : Not at all   Social Connections: Moderately Isolated (10/29/2022)   Social Connection and Isolation Panel [NHANES]    Frequency of Communication with Friends and Family: More than three times a week    Frequency of Social Gatherings with Friends and Family: More than three times a week    Attends Religious Services: More than 4 times per year    Active Member of [employer] or Organizations: No    Attends Banker Meetings: Never    Marital Status: Never married  Intimate Partner Violence: Not At Risk (10/29/2022)   Humiliation, Afraid, Rape, and Kick questionnaire    Fear of Current or Ex-Partner: No    Emotionally Abused: No    Physically Abused: No    Sexually Abused: No    Outpatient Medications Prior to Visit  Medication Sig Dispense Refill   meloxicam (MOBIC) 7.5 MG tablet Take 7.5 mg by mouth daily.     nystatin cream (MYCOSTATIN) APPLY TOPICALLY TO THE AFFECTED AREA TWICE DAILY 30 g 0   ondansetron (ZOFRAN) 4 MG tablet Take 1 tablet (4 mg total) by mouth every 8 (eight) hours as needed for nausea or vomiting. 20 tablet 0   Vitamin D, Ergocalciferol, (DRISDOL) 1.25  MG (50000 UNIT) CAPS capsule Take 1 capsule (50,000 Units total) by mouth every 7 (seven) days. 12 capsule 1   XULANE 150-35 MCG/24HR transdermal patch UNWRAP AND APPLY 1 PATCH TO BUTTOCK OR ABDOMEN ONCE WEEKLY 3 patch 5   No facility-administered medications prior to visit.    Allergies  Allergen Reactions   Ceclor [Cefaclor] Nausea And Vomiting    Tolerates ceftriaxone fine   Amoxicillin Rash    Mild rash as a young child. Unknown cephalosporin history/not tried    Review of Systems  Constitutional:  Positive for appetite change (less) and fatigue. Negative for chills and fever.  HENT:  Positive for congestion and sore throat. Negative for ear pain.   Respiratory:  Positive for cough. Negative for shortness of breath.   Cardiovascular:  Negative for chest pain.  Musculoskeletal:  Positive for myalgias.  Skin:   Negative for rash.  Neurological:  Negative for headaches.       Objective:        03/18/2023    2:10 PM 10/29/2022   11:24 AM 08/05/2022    7:59 AM  Vitals with BMI  Height 4\' 11"  4\' 11"  4\' 11"   Weight 140 lbs 145 lbs 145 lbs  BMI 28.26 29.27 29.27  Systolic 110 136 161  Diastolic 76 80 89  Pulse 68 65 75    No data found.   Physical Exam Vitals reviewed.  Constitutional:      Appearance: Normal appearance.  HENT:     Right Ear: Tympanic membrane, ear canal and external ear normal.     Left Ear: Ear canal and external ear normal. Tympanic membrane is not erythematous.     Nose: Congestion (erythematous turbinates) present.     Mouth/Throat:     Pharynx: Oropharynx is clear. No oropharyngeal exudate or posterior oropharyngeal erythema.  Cardiovascular:     Rate and Rhythm: Normal rate and regular rhythm.     Heart sounds: Normal heart sounds. No murmur heard. Pulmonary:     Effort: Pulmonary effort is normal. No respiratory distress.     Breath sounds: Normal breath sounds.  Lymphadenopathy:     Cervical: No cervical adenopathy.  Neurological:     Mental Status: She is alert and oriented to person, place, and time.  Psychiatric:        Mood and Affect: Mood normal.        Behavior: Behavior normal.     Health Maintenance Due  Topic Date Due   Hepatitis C Screening  Never done   DTaP/Tdap/Td (1 - Tdap) Never done   PAP SMEAR-Modifier  Never done   INFLUENZA VACCINE  Never done   COVID-19 Vaccine (1 - 2023-24 season) Never done    There are no preventive care reminders to display for this patient.   Lab Results  Component Value Date   TSH CANCELED 10/29/2022   Lab Results  Component Value Date   WBC 6.1 10/29/2022   HGB 14.9 10/29/2022   HCT 46.2 10/29/2022   MCV 86 10/29/2022   PLT 219 10/29/2022   Lab Results  Component Value Date   NA CANCELED 10/29/2022   K CANCELED 10/29/2022   CO2 CANCELED 10/29/2022   GLUCOSE CANCELED 10/29/2022   BUN  CANCELED 10/29/2022   CREATININE CANCELED 10/29/2022   BILITOT CANCELED 10/29/2022   ALKPHOS CANCELED 10/29/2022   AST CANCELED 10/29/2022   ALT CANCELED 10/29/2022   PROT CANCELED 10/29/2022   ALBUMIN CANCELED 10/29/2022   CALCIUM CANCELED 10/29/2022  ANIONGAP 9 11/30/2018   EGFR 109 06/19/2021   Lab Results  Component Value Date   CHOL CANCELED 10/29/2022   Lab Results  Component Value Date   HDL CANCELED 10/29/2022   No results found for: "[facility]" Lab Results  Component Value Date   TRIG CANCELED 10/29/2022   No results found for: "CHOLHDL" Lab Results  Component Value Date   HGBA1C 5.9 (H) 10/29/2022       Assessment & Plan:  Sore throat -     POCT rapid strep A  Other non-recurrent acute nonsuppurative otitis media of left ear Assessment & Plan: Zpack given.    Acute bronchitis due to other specified organisms Assessment & Plan: Zpack given.  Continue robitussin    Acute cough -     POC COVID-19 BinaxNow  Other orders -     Azithromycin; Take 2 tablets on day 1, then 1 tablet daily on days 2 through 5  Dispense: 6 tablet; Refill: 0     Meds ordered this encounter  Medications   azithromycin (ZITHROMAX) 250 MG tablet    Sig: Take 2 tablets on day 1, then 1 tablet daily on days 2 through 5    Dispense:  6 tablet    Refill:  0    Orders Placed This Encounter  Procedures   POCT rapid strep A   POC COVID-19     Follow-up: No follow-ups on file.  An After Visit Summary was printed and given to the patient.  Blane Ohara, MD [facility] [phone]

## 2023-03-18 NOTE — Assessment & Plan Note (Signed)
Zpack given.

## 2023-05-05 NOTE — Assessment & Plan Note (Signed)
Recommend continue to work on eating healthy diet and exercise. Check labs 

## 2023-05-05 NOTE — Assessment & Plan Note (Signed)
Check labs. Requires assistance with care.

## 2023-05-05 NOTE — Assessment & Plan Note (Signed)
Complete

## 2023-05-05 NOTE — Assessment & Plan Note (Signed)
Refer to orthopedics 

## 2023-05-05 NOTE — Progress Notes (Signed)
Subjective:  Patient ID: Tina Oliver, female    DOB: February 17, 1986  Age: 37 y.o. MRN: [id number]  Chief Complaint  Patient presents with   Medical Management of Chronic Issues    HPI Patient has chronic muscular dystrophy and cerebral palsy.  She uses wheel chair for transportation.  She has limited ADLs with assistance for dressing and toileting.  Getting to table.  Washing  needs at least One plus assistance.  She is incontinent for urine and stool. Wears Large depends, changes depends 6-7 times a day (56 times a month)also uses bed pads/chux changes 3-4 times a day IADLS - unable to prepare meals, do housework, use telephone, pay bills and writing.  Dependent for all IADLS.  She is permanently blind and was seeing [facility]. She has inability to walk due to MD. And spasticity. She has severe scoliosis. She just got a brace which is helping.  She has some speech issues related to CP. She is able to communicate with her mother and with me some.       03/18/2023    2:17 PM 10/29/2022   11:27 AM 06/19/2021    9:23 AM  Depression screen PHQ 2/9  Decreased Interest 0 0 0  Down, Depressed, Hopeless 0 0 0  PHQ - 2 Score 0 0 0        03/18/2023    2:17 PM  Fall Risk   Falls in the past year? 0  Number falls in past yr: 0  Injury with Fall? 0  Risk for fall due to : Impaired vision;History of fall(s)  Follow up Falls evaluation completed;Falls prevention discussed    Patient Care Team: Jenna Routzahn, Fritzi Mandes, MD as PCP - General (Family Medicine)   Review of Systems  Constitutional:  Negative for chills, fatigue and fever.  HENT:  Negative for congestion, ear pain, rhinorrhea and sore throat.   Respiratory:  Negative for cough and shortness of breath.   Cardiovascular:  Negative for chest pain.  Gastrointestinal:  Negative for abdominal pain, constipation, diarrhea, nausea and vomiting.  Genitourinary:  Negative for dysuria and urgency.  Musculoskeletal:  Negative for back pain and myalgias.   Neurological:  Negative for dizziness, weakness, light-headedness and headaches.  Psychiatric/Behavioral:  Negative for dysphoric mood. The patient is not nervous/anxious.     Current Outpatient Medications on File Prior to Visit  Medication Sig Dispense Refill   meloxicam (MOBIC) 7.5 MG tablet Take 7.5 mg by mouth daily.     nystatin cream (MYCOSTATIN) APPLY TOPICALLY TO THE AFFECTED AREA TWICE DAILY 30 g 0   ondansetron (ZOFRAN) 4 MG tablet Take 1 tablet (4 mg total) by mouth every 8 (eight) hours as needed for nausea or vomiting. 20 tablet 0   Vitamin D, Ergocalciferol, (DRISDOL) 1.25 MG (50000 UNIT) CAPS capsule Take 1 capsule (50,000 Units total) by mouth every 7 (seven) days. 12 capsule 1   XULANE 150-35 MCG/24HR transdermal patch UNWRAP AND APPLY 1 PATCH TO BUTTOCK OR ABDOMEN ONCE WEEKLY 3 patch 5   No current facility-administered medications on file prior to visit.   Past Medical History:  Diagnosis Date   Cerebral palsy (HCC)    Muscular dystrophy (HCC)    Scoliosis    Total blindness    both eyes   History reviewed. No pertinent surgical history.  Family History  Adopted: Yes   Social History   Socioeconomic History   Marital status: Single    Spouse name: Not on file   Number of  children: Not on file   Years of education: Not on file   Highest education level: Not on file  Occupational History   Not on file  Tobacco Use   Smoking status: Never   Smokeless tobacco: Never  Vaping Use   Vaping status: Never Used  Substance and Sexual Activity   Alcohol use: Never   Drug use: Never   Sexual activity: Never  Other Topics Concern   Not on file  Social History Narrative   Not on file   Social Determinants of Health   Financial Resource Strain: Low Risk  (10/29/2022)   Overall Financial Resource Strain (CARDIA)    Difficulty of Paying Living Expenses: Not hard at all  Food Insecurity: No Food Insecurity (10/29/2022)   Hunger Vital Sign    Worried About  Running Out of Food in the Last Year: Never true    Ran Out of Food in the Last Year: Never true  Transportation Needs: No Transportation Needs (10/29/2022)   PRAPARE - Administrator, Civil Service (Medical): No    Lack of Transportation (Non-Medical): No  Physical Activity: Inactive (10/29/2022)   Exercise Vital Sign    Days of Exercise per Week: 0 days    Minutes of Exercise per Session: 0 min  Stress: No Stress Concern Present (10/29/2022)   [employer] of Occupational Health - Occupational Stress Questionnaire    Feeling of Stress : Not at all  Social Connections: Moderately Isolated (10/29/2022)   Social Connection and Isolation Panel [NHANES]    Frequency of Communication with Friends and Family: More than three times a week    Frequency of Social Gatherings with Friends and Family: More than three times a week    Attends Religious Services: More than 4 times per year    Active Member of [employer] or Organizations: No    Attends Engineer, structural: Never    Marital Status: Never married    Objective:  BP 122/72   Pulse 74   Temp (!) 97.4 F (36.3 C)   Ht 4\' 11"  (1.499 m)   Wt 141 lb (64 kg)   SpO2 97%   BMI 28.48 kg/m      05/06/2023    9:35 AM 03/18/2023    2:10 PM 10/29/2022   11:24 AM  BP/Weight  Systolic BP 122 110 136  Diastolic BP 72 76 80  Wt. (Lbs) 141 140 145  BMI 28.48 kg/m2 28.28 kg/m2 29.29 kg/m2    Physical Exam Vitals reviewed.  Constitutional:      Appearance: Normal appearance.  Neck:     Vascular: No carotid bruit.  Cardiovascular:     Rate and Rhythm: Normal rate and regular rhythm.     Heart sounds: Normal heart sounds.  Pulmonary:     Effort: Pulmonary effort is normal. No respiratory distress.     Breath sounds: Normal breath sounds.  Abdominal:     General: Abdomen is flat. Bowel sounds are normal.     Palpations: Abdomen is soft.     Tenderness: There is no abdominal tenderness.  Musculoskeletal:      Comments: Spasms of muscles with contractures.  Neurological:     Mental Status: She is alert. Mental status is at baseline.     Coordination: Coordination abnormal.     Gait: Gait abnormal.  Psychiatric:        Mood and Affect: Mood normal.        Behavior: Behavior normal.  Diabetic Foot Exam - Simple   No data filed      Lab Results  Component Value Date   WBC 7.7 05/06/2023   HGB 14.1 05/06/2023   HCT 45.2 05/06/2023   PLT 259 05/06/2023   GLUCOSE 84 05/06/2023   CHOL 257 (H) 05/06/2023   TRIG 118 05/06/2023   HDL 79 05/06/2023   LDLCALC 158 (H) 05/06/2023   ALT 18 05/06/2023   AST 27 05/06/2023   NA 141 05/06/2023   K 4.2 05/06/2023   CL 103 05/06/2023   CREATININE 0.57 05/06/2023   BUN 11 05/06/2023   CO2 22 05/06/2023   TSH 4.770 (H) 05/06/2023   HGBA1C 5.6 05/06/2023      Assessment & Plan:    Muscular dystrophy (HCC) Assessment & Plan: Requires assistance with care.    Prediabetes Assessment & Plan: Recommend continue to work on eating healthy diet and exercise.  Check labs.  Orders: -     CBC with Differential/Platelet -     Comprehensive metabolic panel -     Lipid panel -     Hemoglobin A1c  Category 5 blindness of both eyes Assessment & Plan: Complete.   Intellectual developmental disorder, moderate Assessment & Plan: Check labs. Requires assistance with care.    Neuromuscular scoliosis of thoracolumbar region Assessment & Plan: Refer to orthopedics.   Orders: -     TSH  Spastic quadriplegic cerebral palsy (HCC) Assessment & Plan: Stable.    Vitamin D insufficiency -     VITAMIN D 25 Hydroxy (Vit-D Deficiency, Fractures)  Need for hepatitis C screening test -     HCV Ab w Reflex to Quant PCR  Other urinary incontinence Assessment & Plan: Wears Large depends, changes depends 6-7 times a day (56 times a month)also uses bed pads/chux changes 3-4 times a day   Other orders -     Interpretation:     No orders  of the defined types were placed in this encounter.   Orders Placed This Encounter  Procedures   CBC with Differential/Platelet   Comprehensive metabolic panel   Lipid panel   Hemoglobin A1c   TSH   VITAMIN D 25 Hydroxy (Vit-D Deficiency, Fractures)   HCV Ab w Reflex to Quant PCR   Interpretation:     Follow-up: Return in about 6 months (around 11/04/2023) for cpe.   I,Katherina A Bramblett,acting as a scribe for Blane Ohara, MD.,have documented all relevant documentation on the behalf of Blane Ohara, MD,as directed by  Blane Ohara, MD while in the presence of Blane Ohara, MD.   An After Visit Summary was printed and given to the patient.  Blane Ohara, MD [facility] [phone]

## 2023-05-05 NOTE — Assessment & Plan Note (Signed)
Requires assistance with care.

## 2023-05-05 NOTE — Assessment & Plan Note (Signed)
Stable

## 2023-05-06 ENCOUNTER — Ambulatory Visit (INDEPENDENT_AMBULATORY_CARE_PROVIDER_SITE_OTHER): Payer: MEDICAID | Admitting: Family Medicine

## 2023-05-06 ENCOUNTER — Encounter: Payer: Self-pay | Admitting: Family Medicine

## 2023-05-06 VITALS — BP 122/72 | HR 74 | Temp 97.4°F | Ht 59.0 in | Wt 141.0 lb

## 2023-05-06 DIAGNOSIS — Z1159 Encounter for screening for other viral diseases: Secondary | ICD-10-CM

## 2023-05-06 DIAGNOSIS — G800 Spastic quadriplegic cerebral palsy: Secondary | ICD-10-CM

## 2023-05-06 DIAGNOSIS — M4145 Neuromuscular scoliosis, thoracolumbar region: Secondary | ICD-10-CM

## 2023-05-06 DIAGNOSIS — R7303 Prediabetes: Secondary | ICD-10-CM | POA: Diagnosis not present

## 2023-05-06 DIAGNOSIS — G7100 Muscular dystrophy, unspecified: Secondary | ICD-10-CM | POA: Diagnosis not present

## 2023-05-06 DIAGNOSIS — H540X55 Blindness right eye category 5, blindness left eye category 5: Secondary | ICD-10-CM | POA: Diagnosis not present

## 2023-05-06 DIAGNOSIS — F71 Moderate intellectual disabilities: Secondary | ICD-10-CM

## 2023-05-06 DIAGNOSIS — N39498 Other specified urinary incontinence: Secondary | ICD-10-CM

## 2023-05-06 DIAGNOSIS — E559 Vitamin D deficiency, unspecified: Secondary | ICD-10-CM

## 2023-05-07 LAB — COMPREHENSIVE METABOLIC PANEL
ALT: 18 [IU]/L (ref 0–32)
AST: 27 [IU]/L (ref 0–40)
Albumin: 4.1 g/dL (ref 3.9–4.9)
Alkaline Phosphatase: 104 [IU]/L (ref 44–121)
BUN/Creatinine Ratio: 19 (ref 9–23)
BUN: 11 mg/dL (ref 6–20)
Bilirubin Total: 0.3 mg/dL (ref 0.0–1.2)
CO2: 22 mmol/L (ref 20–29)
Calcium: 10 mg/dL (ref 8.7–10.2)
Chloride: 103 mmol/L (ref 96–106)
Creatinine, Ser: 0.57 mg/dL (ref 0.57–1.00)
Globulin, Total: 3.1 g/dL (ref 1.5–4.5)
Glucose: 84 mg/dL (ref 70–99)
Potassium: 4.2 mmol/L (ref 3.5–5.2)
Sodium: 141 mmol/L (ref 134–144)
Total Protein: 7.2 g/dL (ref 6.0–8.5)
eGFR: 121 mL/min/{1.73_m2} (ref >59)

## 2023-05-07 LAB — CBC WITH DIFFERENTIAL/PLATELET
Basophils Absolute: 0.1 10*3/uL (ref 0.0–0.2)
Basos: 1 %
EOS (ABSOLUTE): 0.1 10*3/uL (ref 0.0–0.4)
Eos: 2 %
Hematocrit: 45.2 % (ref 34.0–46.6)
Hemoglobin: 14.1 g/dL (ref 11.1–15.9)
Immature Grans (Abs): 0 10*3/uL (ref 0.0–0.1)
Immature Granulocytes: 0 %
Lymphocytes Absolute: 4.3 10*3/uL — ABNORMAL HIGH (ref 0.7–3.1)
Lymphs: 56 %
MCH: 27.3 pg (ref 26.6–33.0)
MCHC: 31.2 g/dL — ABNORMAL LOW (ref 31.5–35.7)
MCV: 87 fL (ref 79–97)
Monocytes Absolute: 0.5 10*3/uL (ref 0.1–0.9)
Monocytes: 7 %
Neutrophils Absolute: 2.7 10*3/uL (ref 1.4–7.0)
Neutrophils: 34 %
Platelets: 259 10*3/uL (ref 150–450)
RBC: 5.17 x10E6/uL (ref 3.77–5.28)
RDW: 13.1 % (ref 11.7–15.4)
WBC: 7.7 10*3/uL (ref 3.4–10.8)

## 2023-05-07 LAB — HCV AB W REFLEX TO QUANT PCR: HCV Ab: NONREACTIVE

## 2023-05-07 LAB — LIPID PANEL
Chol/HDL Ratio: 3.3 ratio (ref 0.0–4.4)
Cholesterol, Total: 257 mg/dL — ABNORMAL HIGH (ref 100–199)
HDL: 79 mg/dL (ref >39)
LDL Chol Calc (NIH): 158 mg/dL — ABNORMAL HIGH (ref 0–99)
Triglycerides: 118 mg/dL (ref 0–149)
VLDL Cholesterol Cal: 20 mg/dL (ref 5–40)

## 2023-05-07 LAB — HEMOGLOBIN A1C
Est. average glucose Bld gHb Est-mCnc: 114 mg/dL
Hgb A1c MFr Bld: 5.6 % (ref 4.8–5.6)

## 2023-05-07 LAB — TSH: TSH: 4.77 u[IU]/mL — ABNORMAL HIGH (ref 0.450–4.500)

## 2023-05-07 LAB — VITAMIN D 25 HYDROXY (VIT D DEFICIENCY, FRACTURES): Vit D, 25-Hydroxy: 25.9 ng/mL — ABNORMAL LOW (ref 30.0–100.0)

## 2023-05-07 LAB — HCV INTERPRETATION

## 2023-05-09 DIAGNOSIS — R32 Unspecified urinary incontinence: Secondary | ICD-10-CM | POA: Insufficient documentation

## 2023-05-09 DIAGNOSIS — E559 Vitamin D deficiency, unspecified: Secondary | ICD-10-CM | POA: Insufficient documentation

## 2023-05-09 DIAGNOSIS — Z1159 Encounter for screening for other viral diseases: Secondary | ICD-10-CM | POA: Insufficient documentation

## 2023-05-09 NOTE — Assessment & Plan Note (Signed)
Wears Large depends, changes depends 6-7 times a day (56 times a month)also uses bed pads/chux changes 3-4 times a day

## 2023-05-11 ENCOUNTER — Other Ambulatory Visit: Payer: Self-pay

## 2023-05-11 MED ORDER — ROSUVASTATIN CALCIUM 20 MG PO TABS: 20.0000 mg | ORAL_TABLET | Freq: Every day | ORAL | 2 refills | Status: DC

## 2023-05-11 MED ORDER — VITAMIN D (ERGOCALCIFEROL) 1.25 MG (50000 UNIT) PO CAPS: 50000.0000 [IU] | ORAL_CAPSULE | ORAL | 1 refills | Status: DC

## 2023-05-11 MED ORDER — LEVOTHYROXINE SODIUM 25 MCG PO TABS: 25.0000 ug | ORAL_TABLET | Freq: Every day | ORAL | 2 refills | Status: DC

## 2023-05-11 MED ORDER — NORELGESTROMIN-ETH ESTRADIOL 150-35 MCG/24HR TD PTWK: 1.0000 | MEDICATED_PATCH | TRANSDERMAL | 5 refills | Status: DC

## 2023-05-28 ENCOUNTER — Encounter: Payer: Self-pay | Admitting: Physician Assistant

## 2023-05-28 ENCOUNTER — Ambulatory Visit (INDEPENDENT_AMBULATORY_CARE_PROVIDER_SITE_OTHER): Payer: MEDICAID | Admitting: Physician Assistant

## 2023-05-28 VITALS — BP 120/80 | HR 88 | Temp 97.4°F | Resp 14 | Ht 59.0 in | Wt 141.0 lb

## 2023-05-28 DIAGNOSIS — R051 Acute cough: Secondary | ICD-10-CM

## 2023-05-28 DIAGNOSIS — J029 Acute pharyngitis, unspecified: Secondary | ICD-10-CM | POA: Diagnosis not present

## 2023-05-28 LAB — POCT INFLUENZA A/B
Influenza A, POC: NEGATIVE
Influenza B, POC: NEGATIVE

## 2023-05-28 LAB — POC COVID19 BINAXNOW: SARS Coronavirus 2 Ag: NEGATIVE

## 2023-05-28 MED ORDER — CEFTRIAXONE SODIUM 1 G IJ SOLR
1.0000 g | Freq: Once | INTRAMUSCULAR | Status: AC
  Administered 2023-05-28: 1 g via INTRAMUSCULAR

## 2023-05-28 MED ORDER — PREDNISONE 20 MG PO TABS: ORAL_TABLET | ORAL | 0 refills | Status: AC

## 2023-05-28 NOTE — Progress Notes (Unsigned)
Subjective:  Patient ID: Tina Oliver, female    DOB: 1986-03-06  Age: 37 y.o. MRN: [id number]  Chief Complaint  Patient presents with   Cough   Fever    HPI   Discussed the use of AI scribe software for clinical note transcription with the patient, who gave verbal consent to proceed.  History of Present Illness   The patient, with a history of being immune compromised, presents with a week-long history of cough, congestion, and a low-grade fever. The patient's caregiver reports that the symptoms started this week, possibly over the weekend. The patient's cough is described as wet, and she has difficulty clearing secretions, leading to a choking sensation. She has been taking Mucinex, which seems to be helping slightly. The patient also has a history of multiple ear infections and has had tubes placed in the past. The patient's caregiver mentions that the patient's granddaughter had a bacterial H influenza infection, and she suspects the patient may have contracted it from her. The patient also has a history of sinus infections and is experiencing pain in the forehead and nose, suggesting possible sinus involvement.         03/18/2023    2:17 PM 10/29/2022   11:27 AM 06/19/2021    9:23 AM  Depression screen PHQ 2/9  Decreased Interest 0 0 0  Down, Depressed, Hopeless 0 0 0  PHQ - 2 Score 0 0 0        03/18/2023    2:17 PM  Fall Risk   Falls in the past year? 0  Number falls in past yr: 0  Injury with Fall? 0  Risk for fall due to : Impaired vision;History of fall(s)  Follow up Falls evaluation completed;Falls prevention discussed    Patient Care Team: Cox, Fritzi Mandes, MD as PCP - General (Family Medicine)   Review of Systems  Constitutional:  Positive for fatigue and fever. Negative for chills.  HENT:  Positive for congestion and sore throat. Negative for ear pain.   Respiratory:  Positive for cough. Negative for shortness of breath.   Cardiovascular:  Negative for chest  pain and palpitations.  Gastrointestinal:  Negative for abdominal pain, constipation, diarrhea, nausea and vomiting.  Endocrine: Negative for polydipsia, polyphagia and polyuria.  Genitourinary:  Negative for difficulty urinating and dysuria.  Musculoskeletal:  Negative for arthralgias, back pain and myalgias.  Skin:  Negative for rash.  Neurological:  Negative for headaches.  Psychiatric/Behavioral:  Negative for dysphoric mood. The patient is not nervous/anxious.     Current Outpatient Medications on File Prior to Visit  Medication Sig Dispense Refill   levothyroxine (SYNTHROID) 25 MCG tablet Take 1 tablet (25 mcg total) by mouth daily before breakfast. 30 tablet 2   meloxicam (MOBIC) 7.5 MG tablet Take 7.5 mg by mouth daily.     norelgestromin-ethinyl estradiol [facility]) 150-35 MCG/24HR transdermal patch Place 1 patch onto the skin once a week. 3 patch 5   nystatin cream (MYCOSTATIN) APPLY TOPICALLY TO THE AFFECTED AREA TWICE DAILY 30 g 0   ondansetron (ZOFRAN) 4 MG tablet Take 1 tablet (4 mg total) by mouth every 8 (eight) hours as needed for nausea or vomiting. 20 tablet 0   rosuvastatin (CRESTOR) 20 MG tablet Take 1 tablet (20 mg total) by mouth daily. 30 tablet 2   Vitamin D, Ergocalciferol, (DRISDOL) 1.25 MG (50000 UNIT) CAPS capsule Take 1 capsule (50,000 Units total) by mouth 2 (two) times a week. 24 capsule 1   No current  facility-administered medications on file prior to visit.   Past Medical History:  Diagnosis Date   Cerebral palsy (HCC)    Muscular dystrophy (HCC)    Scoliosis    Total blindness    both eyes   History reviewed. No pertinent surgical history.  Family History  Adopted: Yes   Social History   Socioeconomic History   Marital status: Single    Spouse name: Not on file   Number of children: Not on file   Years of education: Not on file   Highest education level: Not on file  Occupational History   Not on file  Tobacco Use   Smoking status: Never    Smokeless tobacco: Never  Vaping Use   Vaping status: Never Used  Substance and Sexual Activity   Alcohol use: Never   Drug use: Never   Sexual activity: Never  Other Topics Concern   Not on file  Social History Narrative   Not on file   Social Determinants of Health   Financial Resource Strain: Low Risk  (10/29/2022)   Overall Financial Resource Strain (CARDIA)    Difficulty of Paying Living Expenses: Not hard at all  Food Insecurity: No Food Insecurity (10/29/2022)   Hunger Vital Sign    Worried About Running Out of Food in the Last Year: Never true    Ran Out of Food in the Last Year: Never true  Transportation Needs: No Transportation Needs (10/29/2022)   PRAPARE - Administrator, Civil Service (Medical): No    Lack of Transportation (Non-Medical): No  Physical Activity: Inactive (10/29/2022)   Exercise Vital Sign    Days of Exercise per Week: 0 days    Minutes of Exercise per Session: 0 min  Stress: No Stress Concern Present (10/29/2022)   [employer] of Occupational Health - Occupational Stress Questionnaire    Feeling of Stress : Not at all  Social Connections: Moderately Isolated (10/29/2022)   Social Connection and Isolation Panel [NHANES]    Frequency of Communication with Friends and Family: More than three times a week    Frequency of Social Gatherings with Friends and Family: More than three times a week    Attends Religious Services: More than 4 times per year    Active Member of [employer] or Organizations: No    Attends Engineer, structural: Never    Marital Status: Never married    Objective:  BP 120/80   Pulse 88   Temp (!) 97.4 F (36.3 C)   Resp 14   Ht 4\' 11"  (1.499 m)   Wt 141 lb (64 kg) Comment: wheelchair  SpO2 93%   BMI 28.48 kg/m      05/28/2023   11:29 AM 05/06/2023    9:35 AM 03/18/2023    2:10 PM  BP/Weight  Systolic BP 120 122 110  Diastolic BP 80 72 76  Wt. (Lbs) 141 141 140  BMI 28.48 kg/m2 28.48 kg/m2  28.28 kg/m2    Physical Exam Vitals reviewed.  Constitutional:      Appearance: Normal appearance.  Cardiovascular:     Rate and Rhythm: Normal rate and regular rhythm.     Heart sounds: Normal heart sounds.  Pulmonary:     Effort: Pulmonary effort is normal.     Breath sounds: Wheezing present.  Abdominal:     General: Bowel sounds are normal.     Palpations: Abdomen is soft.     Tenderness: There is no abdominal tenderness.  Neurological:     Mental Status: She is alert and oriented to person, place, and time.  Psychiatric:        Mood and Affect: Mood normal.        Behavior: Behavior normal.     Diabetic Foot Exam - Simple   No data filed      Lab Results  Component Value Date   WBC 7.7 05/06/2023   HGB 14.1 05/06/2023   HCT 45.2 05/06/2023   PLT 259 05/06/2023   GLUCOSE 84 05/06/2023   CHOL 257 (H) 05/06/2023   TRIG 118 05/06/2023   HDL 79 05/06/2023   LDLCALC 158 (H) 05/06/2023   ALT 18 05/06/2023   AST 27 05/06/2023   NA 141 05/06/2023   K 4.2 05/06/2023   CL 103 05/06/2023   CREATININE 0.57 05/06/2023   BUN 11 05/06/2023   CO2 22 05/06/2023   TSH 4.770 (H) 05/06/2023   HGBA1C 5.6 05/06/2023      Assessment & Plan:    Acute cough Assessment & Plan: Recent onset of cough and congestion with low-grade fever. No history of pneumonia. Immune compromised status. No improvement with Mucinex. No nausea, vomiting, or diarrhea. -Order respiratory panel to identify potential viral or bacterial cause. -Administer Rocephin shot to bypass gastrointestinal side effects of oral antibiotics. -Prescribe prednisone taper to reduce inflammation and cough severity.  Orders: -     POC COVID-19 BinaxNow -     POCT Influenza A/B -     Respiratory Panel w/ SARS-CoV2 -     cefTRIAXone Sodium -     predniSONE; Take 3 tablets (60 mg total) by mouth daily with breakfast for 3 days, THEN 2 tablets (40 mg total) daily with breakfast for 3 days, THEN 1 tablet (20 mg  total) daily with breakfast for 3 days.  Dispense: 18 tablet; Refill: 0  Sore throat Assessment & Plan: Continue symptomatic treatment Continue to monitor symptoms Will test for strep if symptoms change      Meds ordered this encounter  Medications   cefTRIAXone (ROCEPHIN) injection 1 g   predniSONE (DELTASONE) 20 MG tablet    Sig: Take 3 tablets (60 mg total) by mouth daily with breakfast for 3 days, THEN 2 tablets (40 mg total) daily with breakfast for 3 days, THEN 1 tablet (20 mg total) daily with breakfast for 3 days.    Dispense:  18 tablet    Refill:  0    Orders Placed This Encounter  Procedures   Respiratory Panel w/ SARS-CoV2   POC COVID-19   POCT Influenza A/B    Assessment and Plan        Follow-up: No follow-ups on file.   I,Marla I Leal-Borjas,acting as a scribe for [employer], PA.,have documented all relevant documentation on the behalf of Langley Gauss, PA,as directed by  Langley Gauss, PA while in the presence of Langley Gauss, [place].   An After Visit Summary was printed and given to the patient.  Langley Gauss, [place] [facility] [phone]

## 2023-05-31 LAB — RESPIRATORY PANEL W/ SARS-COV2
Adenovirus: NOT DETECTED
Bordetella parapertussis: NOT DETECTED
Bordetella pertussis: NOT DETECTED
Chlamydophila pneumoniae: NOT DETECTED
Coronavirus 229E: NOT DETECTED
Coronavirus HKU1: NOT DETECTED
Coronavirus NL63: NOT DETECTED
Coronavirus OC43: NOT DETECTED
Human Metapneumovirus: NOT DETECTED
Human Rhinovirus/Enterovirus: DETECTED — AB
Influenza A/H1-2009: NOT DETECTED
Influenza A/H1: NOT DETECTED
Influenza A/H3: NOT DETECTED
Influenza A: NOT DETECTED
Influenza B: NOT DETECTED
Mycoplasma pneumoniae: NOT DETECTED
Parainfluenza 1: NOT DETECTED
Parainfluenza 2: NOT DETECTED
Parainfluenza 3: NOT DETECTED
Parainfluenza 4: NOT DETECTED
Respiratory Syncytial Virus: NOT DETECTED
SARS-CoV-2: NOT DETECTED

## 2023-06-01 ENCOUNTER — Telehealth: Payer: Self-pay

## 2023-06-01 NOTE — Telephone Encounter (Signed)
The patients mother called this afternoon wanting to know the results of Tina Oliver's Respiratory Panel w/ SARS-CoV2. Can a nurse please call this patients mother back.

## 2023-06-01 NOTE — Telephone Encounter (Signed)
Shelia made aware of results.

## 2023-06-02 NOTE — Assessment & Plan Note (Signed)
Recent onset of cough and congestion with low-grade fever. No history of pneumonia. Immune compromised status. No improvement with Mucinex. No nausea, vomiting, or diarrhea. -Order respiratory panel to identify potential viral or bacterial cause. -Administer Rocephin shot to bypass gastrointestinal side effects of oral antibiotics. -Prescribe prednisone taper to reduce inflammation and cough severity.

## 2023-06-02 NOTE — Assessment & Plan Note (Signed)
Continue symptomatic treatment Continue to monitor symptoms Will test for strep if symptoms change

## 2023-06-09 ENCOUNTER — Other Ambulatory Visit: Payer: Self-pay | Admitting: Family Medicine

## 2023-08-12 ENCOUNTER — Other Ambulatory Visit: Payer: Self-pay | Admitting: Family Medicine

## 2023-08-25 ENCOUNTER — Other Ambulatory Visit: Payer: Self-pay | Admitting: Family Medicine

## 2023-08-25 ENCOUNTER — Telehealth: Payer: Self-pay

## 2023-08-25 ENCOUNTER — Encounter: Payer: Self-pay | Admitting: Family Medicine

## 2023-08-25 ENCOUNTER — Other Ambulatory Visit: Payer: Self-pay

## 2023-08-25 MED ORDER — OSELTAMIVIR PHOSPHATE 75 MG PO CAPS: 75.0000 mg | ORAL_CAPSULE | Freq: Every day | ORAL | 0 refills | Status: DC

## 2023-08-25 MED ORDER — BENZONATATE 200 MG PO CAPS: 200.0000 mg | ORAL_CAPSULE | Freq: Three times a day (TID) | ORAL | 0 refills | Status: DC | PRN

## 2023-08-25 NOTE — Telephone Encounter (Signed)
Copied from CRM [id number]. Topic: Clinical - Medical Advice >> Aug 25, 2023  1:29 PM Antwanette L wrote: Reason for CRM: Patient mother Silvio Pate) is calling in because the patient tested positive for the flu on 2/7. Patient used an at home kit.  According to the patients mother  she has a bad cough wants to know if Dr. Sedalia Muta can prescribe anything?

## 2023-08-25 NOTE — Telephone Encounter (Signed)
Patient tested positive for cough last Friday requesting something for cough. Per Dr. Sedalia Muta ok to send tessalon pearls.

## 2023-09-13 ENCOUNTER — Other Ambulatory Visit: Payer: Self-pay | Admitting: Family Medicine

## 2023-10-21 ENCOUNTER — Other Ambulatory Visit: Payer: Self-pay | Admitting: Family Medicine

## 2023-11-09 ENCOUNTER — Encounter: Payer: MEDICAID | Admitting: Family Medicine

## 2023-12-23 ENCOUNTER — Ambulatory Visit (INDEPENDENT_AMBULATORY_CARE_PROVIDER_SITE_OTHER): Payer: MEDICAID | Admitting: Family Medicine

## 2023-12-23 ENCOUNTER — Encounter: Payer: Self-pay | Admitting: Family Medicine

## 2023-12-23 VITALS — BP 118/60 | HR 67 | Temp 97.9°F | Ht 59.0 in | Wt 138.0 lb

## 2023-12-23 DIAGNOSIS — G808 Other cerebral palsy: Secondary | ICD-10-CM

## 2023-12-23 DIAGNOSIS — Z0001 Encounter for general adult medical examination with abnormal findings: Secondary | ICD-10-CM

## 2023-12-23 DIAGNOSIS — Z23 Encounter for immunization: Secondary | ICD-10-CM | POA: Diagnosis not present

## 2023-12-23 NOTE — Progress Notes (Signed)
 Subjective:  Patient ID: Tina Oliver, female    DOB: November 04, 1985  Age: 38 y.o. MRN: [id number]  Chief Complaint  Patient presents with   Annual Exam    Well Adult Physical: Patient here for a comprehensive physical exam.The patient reports no problems Patient has not been taking medications. Her mother has been busy and forgetting to give her medicines. She intends to do better.   Do you take any herbs or supplements that were not prescribed by a doctor? no Are you taking calcium  supplements? no Are you taking aspirin daily? no  Encounter for general adult medical examination without abnormal findings  Physical (At Risk items are starred): Patient's last physical exam was 1 year ago .  Patient is not afflicted from Stress Incontinence and Urge Incontinence  Patient wears a seat belts Patient has smoke detectors and has carbon monoxide detectors. Patient practices appropriate gun safety. Patient wears sunscreen with extended sun exposure. Dental Care: biannual cleanings, brushes and flosses daily. Ophthalmology/Optometry: no longer needed.  Hearing loss: none Vision impairments: Blind  Menarche: Teenager Menstrual History: No monthly cycle LMP: No monthly cycle due to patches Pregnancy history: no Safe at home: yes Self breast exams: no     12/23/2023   10:10 AM 03/18/2023    2:17 PM 10/29/2022   11:27 AM 06/19/2021    9:23 AM  Depression screen PHQ 2/9  Decreased Interest 0 0 0 0  Down, Depressed, Hopeless 0 0 0 0  PHQ - 2 Score 0 0 0 0         11/30/2018    8:11 AM 11/30/2018    9:00 PM 12/01/2018    9:00 AM 10/29/2022   11:27 AM 03/18/2023    2:17 PM  Fall Risk  Falls in the past year?    1 0  Was there an injury with Fall?    1 0  Fall Risk Category Calculator    3 0  (RETIRED) Patient Fall Risk Level High fall risk  High fall risk  High fall risk     Patient at Risk for Falls Due to    Impaired balance/gait;Impaired mobility;Impaired vision;History of fall(s)  Impaired vision;History of fall(s)  Fall risk Follow up    Falls prevention discussed;Falls evaluation completed Falls evaluation completed;Falls prevention discussed     Data saved with a previous flowsheet row definition             Social Hx   Social History   Socioeconomic History   Marital status: Single    Spouse name: Not on file   Number of children: Not on file   Years of education: Not on file   Highest education level: Not on file  Occupational History   Not on file  Tobacco Use   Smoking status: Never   Smokeless tobacco: Never  Vaping Use   Vaping status: Never Used  Substance and Sexual Activity   Alcohol use: Never   Drug use: Never   Sexual activity: Never  Other Topics Concern   Not on file  Social History Narrative   Not on file   Social Drivers of Health   Financial Resource Strain: Low Risk  (12/23/2023)   Overall Financial Resource Strain (CARDIA)    Difficulty of Paying Living Expenses: Not hard at all  Food Insecurity: No Food Insecurity (12/23/2023)   Hunger Vital Sign    Worried About Running Out of Food in the Last Year: Never true    Ran  Out of Food in the Last Year: Never true  Transportation Needs: No Transportation Needs (12/23/2023)   PRAPARE - Administrator, Civil Service (Medical): No    Lack of Transportation (Non-Medical): No  Physical Activity: Inactive (10/29/2022)   Exercise Vital Sign    Days of Exercise per Week: 0 days    Minutes of Exercise per Session: 0 min  Stress: No Stress Concern Present (12/23/2023)   [employer] of Occupational Health - Occupational Stress Questionnaire    Feeling of Stress: Not at all  Social Connections: Moderately Isolated (12/23/2023)   Social Connection and Isolation Panel    Frequency of Communication with Friends and Family: More than three times a week    Frequency of Social Gatherings with Friends and Family: More than three times a week    Attends Religious Services:  More than 4 times per year    Active Member of [employer] or Organizations: No    Attends Banker Meetings: Never    Marital Status: Never married   Past Medical History:  Diagnosis Date   Cerebral palsy (HCC)    Muscular dystrophy (HCC)    Scoliosis    Total blindness    both eyes   History reviewed. No pertinent surgical history.  Family History  Adopted: Yes    Review of Systems  Constitutional:  Negative for chills, fatigue and fever.  HENT:  Negative for congestion, ear pain, rhinorrhea and sore throat.   Respiratory:  Negative for cough and shortness of breath.   Cardiovascular:  Negative for chest pain.  Gastrointestinal:  Negative for abdominal pain, constipation, diarrhea, nausea and vomiting.  Genitourinary:  Negative for dysuria and urgency.  Musculoskeletal:  Negative for back pain and myalgias.  Neurological:  Negative for dizziness, weakness, light-headedness and headaches.  Psychiatric/Behavioral:  Negative for dysphoric mood. The patient is not nervous/anxious.      Objective:  BP 118/60   Pulse 67   Temp 97.9 F (36.6 C)   Ht 4' 11 (1.499 m)   Wt 138 lb (62.6 kg)   SpO2 97%   BMI 27.87 kg/m      12/23/2023   10:03 AM 05/28/2023   11:29 AM 05/06/2023    9:35 AM  BP/Weight  Systolic BP 118 120 122  Diastolic BP 60 80 72  Wt. (Lbs) 138 141 141  BMI 27.87 kg/m2 28.48 kg/m2 28.48 kg/m2    Physical Exam Vitals reviewed.  Constitutional:      General: She is not in acute distress.    Appearance: Normal appearance. She is normal weight.  HENT:     Right Ear: Tympanic membrane and ear canal normal.     Left Ear: Tympanic membrane and ear canal normal.     Nose: Nose normal. No congestion or rhinorrhea.  Neck:     Thyroid: No thyroid mass.   Cardiovascular:     Rate and Rhythm: Normal rate and regular rhythm.     Pulses: Normal pulses.     Heart sounds: Normal heart sounds. No murmur heard. Pulmonary:     Effort: Pulmonary effort  is normal.     Breath sounds: Normal breath sounds.  Abdominal:     General: Bowel sounds are normal.     Palpations: Abdomen is soft. There is no mass.     Tenderness: There is no abdominal tenderness.   Musculoskeletal:     Cervical back: Neck supple.     Comments: Some muscle spasm.  Cerebral palsy.   Lymphadenopathy:     Cervical: No cervical adenopathy.   Neurological:     Mental Status: She is alert and oriented to person, place, and time.     Gait: Gait abnormal (due to muscle contractures.).   Psychiatric:        Mood and Affect: Mood normal.        Behavior: Behavior normal.     Lab Results  Component Value Date   WBC 7.7 05/06/2023   HGB 14.1 05/06/2023   HCT 45.2 05/06/2023   PLT 259 05/06/2023   GLUCOSE 84 05/06/2023   CHOL 257 (H) 05/06/2023   TRIG 118 05/06/2023   HDL 79 05/06/2023   LDLCALC 158 (H) 05/06/2023   ALT 18 05/06/2023   AST 27 05/06/2023   NA 141 05/06/2023   K 4.2 05/06/2023   CL 103 05/06/2023   CREATININE 0.57 05/06/2023   BUN 11 05/06/2023   CO2 22 05/06/2023   TSH 4.770 (H) 05/06/2023   HGBA1C 5.6 05/06/2023      Assessment & Plan:   Encounter for general adult medical examination with abnormal findings Assessment & Plan: Please work on increasing exercise. Referring for physical therapy.  Please work on taking your medications daily.    Other cerebral palsy (HCC) Assessment & Plan: Stable, but some difficulty with balance.   Orders: -     Ambulatory referral to Physical Therapy  Encounter for immunization Assessment & Plan: TDAP Given.  Orders: -     Tdap vaccine greater than or equal to 7yo IM     Body mass index is 27.87 kg/m.   These are the goals we discussed:  Goals      Increase physical activity         This is a list of the screening recommended for you and due dates:  Health Maintenance  Topic Date Due   HPV Vaccine (1 - 3-dose series) Never done   COVID-19 Vaccine (1 - 2024-25 season)  Never done   Pap with HPV screening  05/05/2024*   Flu Shot  02/11/2024   DTaP/Tdap/Td vaccine (2 - Td or Tdap) 12/22/2033   Hepatitis C Screening  Completed   HIV Screening  Completed   Meningitis B Vaccine  Aged Out  *Topic was postponed. The date shown is not the original due date.     No orders of the defined types were placed in this encounter.   Follow-up: Return in about 3 months (around 03/24/2024) for chronic follow up.  An After Visit Summary was printed and given to the patient.  Mercy Stall, MD [facility] [phone]

## 2023-12-23 NOTE — Patient Instructions (Signed)
 Please work on increasing exercise. Referring for physical therapy.   Please work on taking your medications daily.

## 2023-12-27 DIAGNOSIS — Z23 Encounter for immunization: Secondary | ICD-10-CM | POA: Insufficient documentation

## 2023-12-27 DIAGNOSIS — Z0001 Encounter for general adult medical examination with abnormal findings: Secondary | ICD-10-CM | POA: Insufficient documentation

## 2023-12-27 NOTE — Assessment & Plan Note (Signed)
 Please work on increasing exercise. Referring for physical therapy.   Please work on taking your medications daily.

## 2023-12-27 NOTE — Assessment & Plan Note (Signed)
 TDAP Given

## 2023-12-27 NOTE — Assessment & Plan Note (Signed)
 Stable, but some difficulty with balance.

## 2023-12-29 NOTE — Telephone Encounter (Signed)
 Hello,  Can you look on this? Please Thank you  Copied from CRM [id number]. Topic: Referral - Question >> Dec 29, 2023  1:06 PM Santiya F wrote: Reason for CRM: Velia Gess with [facility] in [place] is calling in because they received a referral for this patient and they don't take her insurance. She says the [place] office accepts her insurance.

## 2024-01-19 ENCOUNTER — Other Ambulatory Visit: Payer: Self-pay | Admitting: Family Medicine

## 2024-02-13 ENCOUNTER — Other Ambulatory Visit: Payer: Self-pay | Admitting: Family Medicine

## 2024-02-14 ENCOUNTER — Telehealth: Payer: Self-pay | Admitting: Family Medicine

## 2024-02-14 NOTE — Telephone Encounter (Signed)
 [employer] CMN

## 2024-03-15 ENCOUNTER — Other Ambulatory Visit: Payer: Self-pay | Admitting: Family Medicine

## 2024-03-15 ENCOUNTER — Other Ambulatory Visit: Payer: Self-pay

## 2024-03-15 MED ORDER — MELOXICAM 7.5 MG PO TABS: 7.5000 mg | ORAL_TABLET | Freq: Every day | ORAL | 0 refills | Status: DC | PRN

## 2024-03-18 ENCOUNTER — Other Ambulatory Visit: Payer: Self-pay | Admitting: Family Medicine

## 2024-03-30 ENCOUNTER — Telehealth: Payer: Self-pay | Admitting: Family Medicine

## 2024-03-30 NOTE — Telephone Encounter (Signed)
 [facility] Initial Evaluation Plan of Care

## 2024-04-03 NOTE — Assessment & Plan Note (Signed)
 Tina Oliver

## 2024-04-03 NOTE — Assessment & Plan Note (Signed)
 SABRA

## 2024-04-03 NOTE — Progress Notes (Unsigned)
 Subjective:  Patient ID: Tina Oliver, female    DOB: [birth year]  Age: 38 y.o. MRN: [id number]  No chief complaint on file.   HPI: Discussed the use of AI scribe software for clinical note transcription with the patient, who gave verbal consent to proceed.  History of Present Illness        12/23/2023   10:10 AM 03/18/2023    2:17 PM 10/29/2022   11:27 AM 06/19/2021    9:23 AM  Depression screen PHQ 2/9  Decreased Interest 0 0 0 0  Down, Depressed, Hopeless 0 0 0 0  PHQ - 2 Score 0 0 0 0        03/18/2023    2:17 PM  Fall Risk   Falls in the past year? 0  Number falls in past yr: 0  Injury with Fall? 0  Risk for fall due to : Impaired vision;History of fall(s)  Follow up Falls evaluation completed;Falls prevention discussed    Patient Care Team: Ayush Boulet, Abigail, MD as PCP - General (Family Medicine)   Review of Systems  Constitutional:  Negative for chills, fatigue and fever.  HENT:  Negative for congestion, ear pain and sore throat.   Respiratory:  Negative for cough and shortness of breath.   Cardiovascular:  Negative for chest pain.  Gastrointestinal:  Negative for abdominal pain, constipation, diarrhea, nausea and vomiting.  Genitourinary:  Negative for dysuria and urgency.  Musculoskeletal:  Negative for arthralgias and myalgias.  Skin:  Negative for rash.  Neurological:  Negative for dizziness and headaches.  Psychiatric/Behavioral:  Negative for dysphoric mood. The patient is not nervous/anxious.     Current Outpatient Medications on File Prior to Visit  Medication Sig Dispense Refill   levothyroxine  (SYNTHROID ) 25 MCG tablet TAKE 1 TABLET(25 MCG) BY MOUTH DAILY BEFORE BREAKFAST 30 tablet 2   meloxicam  (MOBIC ) 7.5 MG tablet Take 1 tablet (7.5 mg total) by mouth daily as needed for pain. 90 tablet 0   nystatin  cream (MYCOSTATIN ) APPLY TOPICALLY TO THE AFFECTED AREA TWICE DAILY 30 g 2   ondansetron  (ZOFRAN ) 4 MG tablet Take 1 tablet (4 mg total) by mouth every  8 (eight) hours as needed for nausea or vomiting. (Patient not taking: Reported on 12/23/2023) 20 tablet 0   rosuvastatin  (CRESTOR ) 20 MG tablet TAKE 1 TABLET(20 MG) BY MOUTH DAILY 90 tablet 0   Vitamin D , Ergocalciferol , (DRISDOL ) 1.25 MG (50000 UNIT) CAPS capsule TAKE 1 CAPSULE BY MOUTH 2 TIMES A WEEK 24 capsule 1   XULANE 150-35 MCG/24HR transdermal patch UNWRAP AND APPLY 1 PATCH TO BUTTOCK OR ABDOMEN ONCE WEEKLY 3 patch 5   No current facility-administered medications on file prior to visit.   Past Medical History:  Diagnosis Date   Cerebral palsy (HCC)    Muscular dystrophy (HCC)    Scoliosis    Total blindness    both eyes   No past surgical history on file.  Family History  Adopted: Yes   Social History   Socioeconomic History   Marital status: Single    Spouse name: Not on file   Number of children: Not on file   Years of education: Not on file   Highest education level: Not on file  Occupational History   Not on file  Tobacco Use   Smoking status: Never   Smokeless tobacco: Never  Vaping Use   Vaping status: Never Used  Substance and Sexual Activity   Alcohol use: Never   Drug use: Never  Sexual activity: Never  Other Topics Concern   Not on file  Social History Narrative   Not on file   Social Drivers of Health   Financial Resource Strain: Low Risk  (12/23/2023)   Overall Financial Resource Strain (CARDIA)    Difficulty of Paying Living Expenses: Not hard at all  Food Insecurity: No Food Insecurity (12/23/2023)   Hunger Vital Sign    Worried About Running Out of Food in the Last Year: Never true    Ran Out of Food in the Last Year: Never true  Transportation Needs: No Transportation Needs (12/23/2023)   PRAPARE - Administrator, Civil Service (Medical): No    Lack of Transportation (Non-Medical): No  Physical Activity: Inactive (10/29/2022)   Exercise Vital Sign    Days of Exercise per Week: 0 days    Minutes of Exercise per Session: 0 min   Stress: No Stress Concern Present (12/23/2023)   [employer] of Occupational Health - Occupational Stress Questionnaire    Feeling of Stress: Not at all  Social Connections: Moderately Isolated (12/23/2023)   Social Connection and Isolation Panel    Frequency of Communication with Friends and Family: More than three times a week    Frequency of Social Gatherings with Friends and Family: More than three times a week    Attends Religious Services: More than 4 times per year    Active Member of [employer] or Organizations: No    Attends Banker Meetings: Never    Marital Status: Never married    Objective:  There were no vitals taken for this visit.     12/23/2023   10:03 AM 05/28/2023   11:29 AM 05/06/2023    9:35 AM  BP/Weight  Systolic BP 118 120 122  Diastolic BP 60 80 72  Wt. (Lbs) 138 141 141  BMI 27.87 kg/m2 28.48 kg/m2 28.48 kg/m2    Physical Exam Vitals reviewed.  Constitutional:      Appearance: Normal appearance. She is obese.  Neck:     Vascular: No carotid bruit.  Cardiovascular:     Rate and Rhythm: Normal rate and regular rhythm.     Heart sounds: Normal heart sounds.  Pulmonary:     Effort: Pulmonary effort is normal. No respiratory distress.     Breath sounds: Normal breath sounds.  Abdominal:     General: Abdomen is flat. Bowel sounds are normal.     Palpations: Abdomen is soft.     Tenderness: There is no abdominal tenderness.  Neurological:     Mental Status: She is alert.  Psychiatric:        Mood and Affect: Mood normal.        Behavior: Behavior normal.     {Perform Simple Foot Exam  Perform Detailed exam:1} {Insert foot Exam (Optional):30965}   Lab Results  Component Value Date   WBC 7.7 05/06/2023   HGB 14.1 05/06/2023   HCT 45.2 05/06/2023   PLT 259 05/06/2023   GLUCOSE 84 05/06/2023   CHOL 257 (H) 05/06/2023   TRIG 118 05/06/2023   HDL 79 05/06/2023   LDLCALC 158 (H) 05/06/2023   ALT 18 05/06/2023   AST 27  05/06/2023   NA 141 05/06/2023   K 4.2 05/06/2023   CL 103 05/06/2023   CREATININE 0.57 05/06/2023   BUN 11 05/06/2023   CO2 22 05/06/2023   TSH 4.770 (H) 05/06/2023   HGBA1C 5.6 05/06/2023    Results for orders placed or  performed in visit on 05/28/23  POC COVID-19   Collection Time: 05/28/23 11:42 AM  Result Value Ref Range   SARS Coronavirus 2 Ag Negative Negative  POCT Influenza A/B   Collection Time: 05/28/23 11:42 AM  Result Value Ref Range   Influenza A, POC Negative Negative   Influenza B, POC Negative Negative  Respiratory Panel w/ SARS-CoV2   Collection Time: 05/28/23 12:25 PM  Result Value Ref Range   Adenovirus Not Detected Not Detected   Coronavirus HKU1 Not Detected Not Detected   Coronavirus NL63 Not Detected Not Detected   Coronavirus 229E Not Detected Not Detected   Coronavirus OC43 Not Detected Not Detected   SARS-CoV-2 Not Detected Not Detected   Human Metapneumovirus Not Detected Not Detected   Human Rhinovirus/Enterovirus Detected (A) Not Detected   Influenza A Not Detected Not Detected   Influenza A/H1 Not Detected Not Detected   Influenza A/H1-2009 Not Detected Not Detected   Influenza A/H3 Not Detected Not Detected   Influenza B Not Detected Not Detected   Parainfluenza 1 Not Detected Not Detected   Parainfluenza 2 Not Detected Not Detected   Parainfluenza 3 Not Detected Not Detected   Parainfluenza 4 Not Detected Not Detected   Respiratory Syncytial Virus Not Detected Not Detected   Bordetella parapertussis Not Detected Not Detected   Bordetella pertussis Not Detected Not Detected   Chlamydophila pneumoniae Not Detected Not Detected   Mycoplasma pneumoniae Not Detected Not Detected  .  Assessment & Plan:   Assessment & Plan Category 5 blindness of both eyes     Muscular dystrophy (HCC)     Other cerebral palsy (HCC)     Prediabetes     Intellectual developmental disorder, moderate     Vitamin D  insufficiency     Mixed  hyperlipidemia     Abnormal TSH       There is no height or weight on file to calculate BMI.  Assessment and Plan Assessment & Plan      No orders of the defined types were placed in this encounter.   No orders of the defined types were placed in this encounter.      Follow-up: No follow-ups on file.  An After Visit Summary was printed and given to the patient.  Abigail Free, MD [facility] [phone]

## 2024-04-04 ENCOUNTER — Ambulatory Visit (INDEPENDENT_AMBULATORY_CARE_PROVIDER_SITE_OTHER): Payer: MEDICAID | Admitting: Family Medicine

## 2024-04-04 ENCOUNTER — Encounter: Payer: Self-pay | Admitting: Family Medicine

## 2024-04-04 VITALS — BP 110/80 | HR 65 | Temp 97.2°F | Resp 16 | Ht 59.0 in | Wt 136.0 lb

## 2024-04-04 DIAGNOSIS — G8929 Other chronic pain: Secondary | ICD-10-CM | POA: Insufficient documentation

## 2024-04-04 DIAGNOSIS — R7989 Other specified abnormal findings of blood chemistry: Secondary | ICD-10-CM | POA: Insufficient documentation

## 2024-04-04 DIAGNOSIS — M4120 Other idiopathic scoliosis, site unspecified: Secondary | ICD-10-CM

## 2024-04-04 DIAGNOSIS — R300 Dysuria: Secondary | ICD-10-CM | POA: Insufficient documentation

## 2024-04-04 DIAGNOSIS — H540X55 Blindness right eye category 5, blindness left eye category 5: Secondary | ICD-10-CM | POA: Diagnosis not present

## 2024-04-04 DIAGNOSIS — F71 Moderate intellectual disabilities: Secondary | ICD-10-CM

## 2024-04-04 DIAGNOSIS — M545 Low back pain, unspecified: Secondary | ICD-10-CM

## 2024-04-04 DIAGNOSIS — G7100 Muscular dystrophy, unspecified: Secondary | ICD-10-CM | POA: Diagnosis not present

## 2024-04-04 DIAGNOSIS — R7303 Prediabetes: Secondary | ICD-10-CM | POA: Diagnosis not present

## 2024-04-04 DIAGNOSIS — G808 Other cerebral palsy: Secondary | ICD-10-CM | POA: Diagnosis not present

## 2024-04-04 DIAGNOSIS — E782 Mixed hyperlipidemia: Secondary | ICD-10-CM

## 2024-04-04 DIAGNOSIS — B3731 Acute candidiasis of vulva and vagina: Secondary | ICD-10-CM | POA: Insufficient documentation

## 2024-04-04 DIAGNOSIS — E559 Vitamin D deficiency, unspecified: Secondary | ICD-10-CM

## 2024-04-04 LAB — POCT LIPID PANEL
HDL: 80
LDL: 127
Non-HDL: 148
TC: 228
TRG: 104

## 2024-04-04 LAB — POCT URINALYSIS DIP (CLINITEK)
Bilirubin, UA: NEGATIVE
Blood, UA: NEGATIVE
Glucose, UA: NEGATIVE mg/dL
Ketones, POC UA: NEGATIVE mg/dL
Leukocytes, UA: NEGATIVE
Nitrite, UA: NEGATIVE
POC PROTEIN,UA: NEGATIVE
Spec Grav, UA: >=1.03 — AB (ref 1.010–1.025)
Urobilinogen, UA: 0.2 U/dL
pH, UA: 5.5 (ref 5.0–8.0)

## 2024-04-04 LAB — POCT GLYCOSYLATED HEMOGLOBIN (HGB A1C): HbA1c POC (<> result, manual entry): 5.3 % (ref 4.0–5.6)

## 2024-04-04 MED ORDER — FLUCONAZOLE 150 MG PO TABS: 150.0000 mg | ORAL_TABLET | Freq: Once | ORAL | 0 refills | Status: AC

## 2024-04-04 MED ORDER — ROSUVASTATIN CALCIUM 40 MG PO TABS: 40.0000 mg | ORAL_TABLET | Freq: Every day | ORAL | 0 refills | Status: DC

## 2024-04-04 NOTE — Assessment & Plan Note (Signed)
 Scoliosis noted, physical therapy planned to improve strength and posture. - Continue with planned physical therapy sessions.

## 2024-04-04 NOTE — Assessment & Plan Note (Addendum)
 Burning urination and itching suggest yeast infection or UTI. Differential includes vulvovaginal candidiasis and UTI. - Obtain urine sample for urinalysis to rule out UTI. - If urinalysis normal, prescribe Diflucan  for 2-3 days. - If symptoms persist, schedule follow-up for physical examination. Patient refuses genital exam today Orders:   POCT URINALYSIS DIP (CLINITEK)

## 2024-04-04 NOTE — Assessment & Plan Note (Addendum)
 Managed with levothyroxine  25 mcg daily. Check levels Orders:   T4, free   TSH

## 2024-04-04 NOTE — Patient Instructions (Signed)
  VISIT SUMMARY: During your visit, we discussed your urinary symptoms, back pain, and other health concerns. We have a plan to address each of these issues, including further testing and adjustments to your medications.  YOUR PLAN: URINARY SYMPTOMS: You have been experiencing burning and itching during urination, which could be due to a yeast infection or a urinary tract infection (UTI). -We will obtain a urine sample for urinalysis to rule out a UTI. -If the urinalysis is normal, we will prescribe Diflucan  for 2-3 days. -If your symptoms persist, please schedule a follow-up appointment for a physical examination.  MIXED HYPERLIPIDEMIA: Your total cholesterol is 228 mg/dL with slightly high LDL levels, but your HDL and triglycerides are excellent. -Increase your rosuvastatin  dose to 40 mg daily. -Until your new prescription is filled, double up on your current 20 mg dose.  HYPOTHYROIDISM: Your hypothyroidism is being managed with levothyroxine . -Continue taking levothyroxine  25 mcg daily as prescribed.  CHRONIC BACK PAIN AND SCOLIOSIS: You have chronic back pain worsened by scoliosis. -Continue taking meloxicam  7.5 mg as needed for discomfort. -Proceed with your planned physical therapy sessions to improve strength and posture.                      Contains text generated by Abridge.                                 Contains text generated by Abridge.

## 2024-04-04 NOTE — Assessment & Plan Note (Addendum)
 Total cholesterol 228 mg/dL, LDL slightly high, HDL and triglycerides excellent. Currently on rosuvastatin  20 mg daily. - Increase rosuvastatin  to 40 mg daily. - Instruct to double up on current 20 mg dose until new prescription is filled.  Orders:   POCT Lipid Panel   Comprehensive metabolic panel with GFR   rosuvastatin  (CRESTOR ) 40 MG tablet; Take 1 tablet (40 mg total) by mouth daily.

## 2024-04-04 NOTE — Assessment & Plan Note (Signed)
 Managed with meloxicam  7.5 mg as needed.

## 2024-04-04 NOTE — Assessment & Plan Note (Signed)
 Burning urination and itching suggest yeast infection or UTI. Differential includes vulvovaginal candidiasis and UTI. - Obtain urine sample for urinalysis to rule out UTI. - If urinalysis normal, prescribe Diflucan  for 2-3 days. - If symptoms persist, schedule follow-up for physical examination.

## 2024-04-11 ENCOUNTER — Ambulatory Visit: Payer: Self-pay | Admitting: Family Medicine

## 2024-05-10 ENCOUNTER — Other Ambulatory Visit: Payer: Self-pay | Admitting: Family Medicine

## 2024-05-15 ENCOUNTER — Other Ambulatory Visit: Payer: Self-pay | Admitting: Family Medicine

## 2024-06-13 ENCOUNTER — Other Ambulatory Visit: Payer: Self-pay | Admitting: Family Medicine

## 2024-07-08 ENCOUNTER — Other Ambulatory Visit: Payer: Self-pay | Admitting: Family Medicine

## 2024-07-08 DIAGNOSIS — E782 Mixed hyperlipidemia: Secondary | ICD-10-CM

## 2024-07-10 ENCOUNTER — Ambulatory Visit: Payer: Self-pay | Admitting: Family Medicine

## 2024-07-10 ENCOUNTER — Encounter: Payer: Self-pay | Admitting: Family Medicine

## 2024-07-10 ENCOUNTER — Ambulatory Visit: Payer: MEDICAID | Admitting: Family Medicine

## 2024-07-10 VITALS — BP 124/74 | HR 63 | Temp 98.0°F | Ht 59.0 in | Wt 132.2 lb

## 2024-07-10 DIAGNOSIS — G808 Other cerebral palsy: Secondary | ICD-10-CM

## 2024-07-10 DIAGNOSIS — G7100 Muscular dystrophy, unspecified: Secondary | ICD-10-CM

## 2024-07-10 DIAGNOSIS — N3942 Incontinence without sensory awareness: Secondary | ICD-10-CM | POA: Diagnosis not present

## 2024-07-10 DIAGNOSIS — H540X55 Blindness right eye category 5, blindness left eye category 5: Secondary | ICD-10-CM | POA: Diagnosis not present

## 2024-07-10 DIAGNOSIS — R7303 Prediabetes: Secondary | ICD-10-CM

## 2024-07-10 DIAGNOSIS — E782 Mixed hyperlipidemia: Secondary | ICD-10-CM | POA: Diagnosis not present

## 2024-07-10 DIAGNOSIS — E039 Hypothyroidism, unspecified: Secondary | ICD-10-CM | POA: Insufficient documentation

## 2024-07-10 DIAGNOSIS — R5383 Other fatigue: Secondary | ICD-10-CM | POA: Insufficient documentation

## 2024-07-10 DIAGNOSIS — M4120 Other idiopathic scoliosis, site unspecified: Secondary | ICD-10-CM | POA: Diagnosis not present

## 2024-07-10 DIAGNOSIS — R400 Somnolence: Secondary | ICD-10-CM | POA: Insufficient documentation

## 2024-07-10 LAB — COMPREHENSIVE METABOLIC PANEL WITH GFR
ALT: 18 IU/L (ref 0–32)
AST: 22 IU/L (ref 0–40)
Albumin: 4 g/dL (ref 3.9–4.9)
Alkaline Phosphatase: 102 IU/L (ref 41–116)
BUN/Creatinine Ratio: 19 (ref 9–23)
BUN: 11 mg/dL (ref 6–20)
Bilirubin Total: 0.3 mg/dL (ref 0.0–1.2)
CO2: 23 mmol/L (ref 20–29)
Calcium: 10 mg/dL (ref 8.7–10.2)
Chloride: 104 mmol/L (ref 96–106)
Creatinine, Ser: 0.57 mg/dL (ref 0.57–1.00)
Globulin, Total: 3.4 g/dL (ref 1.5–4.5)
Glucose: 81 mg/dL (ref 70–99)
Potassium: 4.8 mmol/L (ref 3.5–5.2)
Sodium: 141 mmol/L (ref 134–144)
Total Protein: 7.4 g/dL (ref 6.0–8.5)
eGFR: 120 mL/min/1.73

## 2024-07-10 LAB — TSH: TSH: 2.15 u[IU]/mL (ref 0.450–4.500)

## 2024-07-10 LAB — POCT LIPID PANEL
HDL: 56
LDL: 130
Non-HDL: 149
TC: 205
TRG: 98

## 2024-07-10 LAB — POCT GLYCOSYLATED HEMOGLOBIN (HGB A1C): HbA1c POC (<> result, manual entry): 5.5 %

## 2024-07-10 LAB — T4, FREE: Free T4: 0.93 ng/dL (ref 0.82–1.77)

## 2024-07-10 MED ORDER — SEMAGLUTIDE (2 MG/DOSE) 8 MG/3ML ~~LOC~~ SOPN: 2.0000 mg | PEN_INJECTOR | SUBCUTANEOUS | 0 refills | Status: DC

## 2024-07-10 NOTE — Progress Notes (Signed)
 "  Subjective:  Patient ID: Tina Oliver, female    DOB: 1986/02/23  Age: 38 y.o. MRN: [id number]  Chief Complaint  Patient presents with   Medical Management of Chronic Issues    HPI: Discussed the use of AI scribe software for clinical note transcription with the patient, who gave verbal consent to proceed.  History of Present Illness Tina Oliver is a 38 year old female who presents for a follow-up visit.  Cold intolerance - Persistent cold intolerance - No fevers, chills, or sweats  Gastrointestinal symptoms - Constipation, possibly related to high milk intake - No abdominal pain, bladder issues, or diarrhea  Medication nonadherence - Not currently taking prescribed medications including cholesterol medication, Pristiq 40 mg daily, vitamin D , thyroid medication, or meloxicam   Laboratory findings from today - Most recent LDL cholesterol 130 mg/dL, previously 872 mg/dL - J8r 5.5, within normal limits  Thyroid dysfunction - Not taking thyroid medication - Hair loss has improved since discontinuing thyroid medication  Musculoskeletal symptoms - Occasional leg pain - Scoliosis with worsening back pain - Previously attended physical therapy, discontinued due to lack of perceived benefit       12/23/2023   10:10 AM 03/18/2023    2:17 PM 10/29/2022   11:27 AM 06/19/2021    9:23 AM  Depression screen PHQ 2/9  Decreased Interest 0 0 0 0  Down, Depressed, Hopeless 0 0 0 0  PHQ - 2 Score 0 0 0 0        03/18/2023    2:17 PM  Fall Risk   Falls in the past year? 0  Number falls in past yr: 0  Injury with Fall? 0   Risk for fall due to : Impaired vision;History of fall(s)  Follow up Falls evaluation completed;Falls prevention discussed     Data saved with a previous flowsheet row definition    Patient Care Team: Tyreak Reagle, Abigail, MD as PCP - General (Family Medicine)   Review of Systems  Constitutional:  Negative for chills, fatigue and fever.  HENT:  Negative for  congestion, ear pain and sore throat.   Eyes:        Blind  Respiratory:  Negative for cough and shortness of breath.   Cardiovascular:  Negative for chest pain.  Gastrointestinal:  Negative for abdominal pain, constipation, diarrhea, nausea and vomiting.  Genitourinary:  Negative for dysuria and urgency.       Bladder incontinence  Musculoskeletal:  Positive for back pain. Negative for arthralgias and myalgias.  Neurological:  Negative for dizziness and headaches.  Psychiatric/Behavioral:  Negative for dysphoric mood. The patient is not nervous/anxious.     Medications Ordered Prior to Encounter[1] Past Medical History:  Diagnosis Date   Cerebral palsy (HCC)    Muscular dystrophy (HCC)    Scoliosis    Total blindness    both eyes   Past Surgical History:  Procedure Laterality Date   EYE SURGERY Bilateral 2006   TYMPANOSTOMY TUBE PLACEMENT Bilateral 1993    Family History  Adopted: Yes   Social History   Socioeconomic History   Marital status: Single    Spouse name: Not on file   Number of children: Not on file   Years of education: Not on file   Highest education level: Not on file  Occupational History   Not on file  Tobacco Use   Smoking status: Never   Smokeless tobacco: Never  Vaping Use   Vaping status: Never Used  Substance and Sexual  Activity   Alcohol use: Never   Drug use: Never   Sexual activity: Never  Other Topics Concern   Not on file  Social History Narrative   Not on file   Social Drivers of Health   Tobacco Use: Low Risk (07/10/2024)   Patient History    Smoking Tobacco Use: Never    Smokeless Tobacco Use: Never    Passive Exposure: Not on file  Financial Resource Strain: Low Risk (12/23/2023)   Overall Financial Resource Strain (CARDIA)    Difficulty of Paying Living Expenses: Not hard at all  Food Insecurity: No Food Insecurity (12/23/2023)   Epic    Worried About Programme Researcher, Broadcasting/film/video in the Last Year: Never true    Ran Out of Food in  the Last Year: Never true  Transportation Needs: No Transportation Needs (12/23/2023)   Epic    Lack of Transportation (Medical): No    Lack of Transportation (Non-Medical): No  Physical Activity: Inactive (10/29/2022)   Exercise Vital Sign    Days of Exercise per Week: 0 days    Minutes of Exercise per Session: 0 min  Stress: No Stress Concern Present (12/23/2023)   [employer] of Occupational Health - Occupational Stress Questionnaire    Feeling of Stress: Not at all  Social Connections: Moderately Isolated (12/23/2023)   Social Connection and Isolation Panel    Frequency of Communication with Friends and Family: More than three times a week    Frequency of Social Gatherings with Friends and Family: More than three times a week    Attends Religious Services: More than 4 times per year    Active Member of Clubs or Organizations: No    Attends Banker Meetings: Never    Marital Status: Never married  Depression (PHQ2-9): Low Risk (12/23/2023)   Depression (PHQ2-9)    PHQ-2 Score: 0  Alcohol Screen: Low Risk (12/23/2023)   Alcohol Screen    Last Alcohol Screening Score (AUDIT): 0  Housing: Low Risk (12/23/2023)   Epic    Unable to Pay for Housing in the Last Year: No    Number of Times Moved in the Last Year: 0    Homeless in the Last Year: No  Utilities: Not At Risk (12/23/2023)   Epic    Threatened with loss of utilities: No  Health Literacy: Inadequate Health Literacy (05/06/2023)   B1300 Health Literacy    Frequency of need for help with medical instructions: Always    Objective:  BP 124/74   Pulse 63   Temp 98 F (36.7 C)   Ht 4' 11 (1.499 m)   Wt 132 lb 3.2 oz (60 kg)   SpO2 96%   BMI 26.70 kg/m      07/10/2024    9:43 AM 04/04/2024    9:22 AM 12/23/2023   10:03 AM  BP/Weight  Systolic BP 124 110 118  Diastolic BP 74 80 60  Wt. (Lbs) 132.2 136 138  BMI 26.7 kg/m2 27.47 kg/m2 27.87 kg/m2    Physical Exam Vitals reviewed.  Constitutional:       Appearance: Normal appearance. She is normal weight.  Cardiovascular:     Rate and Rhythm: Normal rate and regular rhythm.     Heart sounds: Normal heart sounds.  Pulmonary:     Effort: Pulmonary effort is normal. No respiratory distress.     Breath sounds: Normal breath sounds.  Abdominal:     General: Abdomen is flat. Bowel sounds are normal.  Palpations: Abdomen is soft.     Tenderness: There is no abdominal tenderness.  Neurological:     Mental Status: She is alert.  Psychiatric:        Mood and Affect: Mood normal.        Behavior: Behavior normal.         Lab Results  Component Value Date   WBC 7.7 05/06/2023   HGB 14.1 05/06/2023   HCT 45.2 05/06/2023   PLT 259 05/06/2023   GLUCOSE 84 05/06/2023   CHOL 257 (H) 05/06/2023   TRIG 118 05/06/2023   HDL 79 05/06/2023   LDLCALC 158 (H) 05/06/2023   ALT 18 05/06/2023   AST 27 05/06/2023   NA 141 05/06/2023   K 4.2 05/06/2023   CL 103 05/06/2023   CREATININE 0.57 05/06/2023   BUN 11 05/06/2023   CO2 22 05/06/2023   TSH 4.770 (H) 05/06/2023   HGBA1C 5.5 07/10/2024    Results for orders placed or performed in visit on 07/10/24  POCT glycosylated hemoglobin (Hb A1C)   Collection Time: 07/10/24 10:10 AM  Result Value Ref Range   Hemoglobin A1C     HbA1c POC (<> result, manual entry) 5.5 4.0 - 5.6 %   HbA1c, POC (prediabetic range)     HbA1c, POC (controlled diabetic range)    POCT Lipid Panel   Collection Time: 07/10/24 10:10 AM  Result Value Ref Range   TC 205    HDL 56    TRG 98    LDL 130    Non-HDL 149    TC/HDL    .  Assessment & Plan:   Assessment & Plan Mixed hyperlipidemia LDL cholesterol levels elevated at 127 mg/dL and 869 mg/dL, above target of less than 100 mg/dL. - Restarted cholesterol medication (rosuvastatin  40 mg before bed). Orders:   POCT Lipid Panel   Comprehensive metabolic panel with GFR  Prediabetes A1c level is 5.5%, indicating prediabetes. Orders:   POCT  glycosylated hemoglobin (Hb A1C)  Acquired hypothyroidism Currently not on thyroid medication. Previous hair loss improved, suggesting possible stabilization of thyroid function. - Ordered thyroid function tests. Orders:   T4, free   TSH  Other cerebral palsy (HCC) stable    Category 5 blindness of both eyes Stable. Legally blind    Other idiopathic scoliosis, unspecified spinal region Has some back pain. Surgery is not recommended.     Muscular dystrophy (HCC) Able to walk. In wheelchair today due to ease of transportation for patient  and her mother.     Urinary incontinence without sensory awareness Wears Large depends, changes depends 6-7 times a day (56 times a month)also uses bed pads/chux changes 3-4 times a day      Body mass index is 26.7 kg/m.    Meds ordered this encounter  Medications   DISCONTD: Semaglutide, 2 MG/DOSE, 8 MG/3ML SOPN    Sig: Inject 2 mg as directed once a week.    Dispense:  9 mL    Refill:  0    Orders Placed This Encounter  Procedures   Comprehensive metabolic panel with GFR   T4, free   TSH   POCT glycosylated hemoglobin (Hb A1C)   POCT Lipid Panel     I,Marla I Leal-Borjas,acting as a scribe for Abigail Free, MD.,have documented all relevant documentation on the behalf of Abigail Free, MD,as directed by  Abigail Free, MD while in the presence of Abigail Free, MD.   Follow-up: Return in about 3 months (around  10/08/2024) for chronic follow up.  An After Visit Summary was printed and given to the patient.  I attest that I have reviewed this visit and agree with the plan scribed by my staff.   Abigail Free, MD [facility] [phone]       [1]  Current Outpatient Medications on File Prior to Visit  Medication Sig Dispense Refill   levothyroxine  (SYNTHROID ) 25 MCG tablet TAKE 1 TABLET(25 MCG) BY MOUTH DAILY BEFORE BREAKFAST 90 tablet 1   meloxicam  (MOBIC ) 7.5 MG tablet TAKE 1 TABLET(7.5 MG) BY MOUTH DAILY AS  NEEDED FOR PAIN 90 tablet 0   nystatin  cream (MYCOSTATIN ) APPLY TOPICALLY TO THE AFFECTED AREA TWICE DAILY 30 g 2   rosuvastatin  (CRESTOR ) 40 MG tablet TAKE 1 TABLET(40 MG) BY MOUTH DAILY 90 tablet 0   Vitamin D , Ergocalciferol , (DRISDOL ) 1.25 MG (50000 UNIT) CAPS capsule TAKE 1 CAPSULE BY MOUTH 2 TIMES A WEEK 24 capsule 1   XULANE 150-35 MCG/24HR transdermal patch UNWRAP AND APPLY 1 PATCH TO BUTTOCK OR ABDOMEN ONCE WEEKLY 3 patch 5   No current facility-administered medications on file prior to visit.   "

## 2024-07-10 NOTE — Assessment & Plan Note (Signed)
 Has some back pain. Surgery is not recommended.

## 2024-07-10 NOTE — Assessment & Plan Note (Addendum)
 LDL cholesterol levels elevated at 127 mg/dL and 869 mg/dL, above target of less than 100 mg/dL. - Restarted cholesterol medication (rosuvastatin  40 mg before bed). Orders:   POCT Lipid Panel   Comprehensive metabolic panel with GFR

## 2024-07-10 NOTE — Assessment & Plan Note (Signed)
 Stable. Legally blind

## 2024-07-10 NOTE — Assessment & Plan Note (Addendum)
 A1c level is 5.5%, indicating prediabetes. Orders:   POCT glycosylated hemoglobin (Hb A1C)

## 2024-07-10 NOTE — Assessment & Plan Note (Addendum)
 Currently not on thyroid medication. Previous hair loss improved, suggesting possible stabilization of thyroid function. - Ordered thyroid function tests. Orders:   T4, free   TSH

## 2024-07-10 NOTE — Assessment & Plan Note (Signed)
 Wears Large depends, changes depends 6-7 times a day (56 times a month)also uses bed pads/chux changes 3-4 times a day

## 2024-07-10 NOTE — Assessment & Plan Note (Signed)
"   stable         "

## 2024-07-10 NOTE — Assessment & Plan Note (Signed)
 Able to walk. In wheelchair today due to ease of transportation for patient  and her mother.

## 2024-07-10 NOTE — Patient Instructions (Signed)
" °  VISIT SUMMARY: You came in for a follow-up visit to discuss several ongoing health issues, including cold intolerance, gastrointestinal symptoms, medication nonadherence, thyroid dysfunction, and musculoskeletal symptoms. We reviewed your lab results and made some adjustments to your treatment plan.  YOUR PLAN: MIXED HYPERLIPIDEMIA: Your LDL cholesterol levels are elevated. -Restart your cholesterol medication.  ACQUIRED HYPOTHYROIDISM: You are not currently taking your thyroid medication, and your hair loss has improved since stopping it. -We have ordered thyroid function tests to check your thyroid levels.  PREDIABETES: Your A1c level is 5.5%, which indicates prediabetes. -Continue monitoring your blood sugar levels and maintain a healthy diet and exercise routine.                      Contains text generated by Abridge.                                 Contains text generated by Abridge.   "

## 2024-07-28 ENCOUNTER — Ambulatory Visit: Payer: Self-pay

## 2024-07-28 ENCOUNTER — Ambulatory Visit: Payer: MEDICAID | Admitting: Family Medicine

## 2024-07-28 ENCOUNTER — Encounter: Payer: Self-pay | Admitting: Family Medicine

## 2024-07-28 VITALS — BP 120/72 | HR 65 | Temp 97.6°F | Resp 18 | Ht 59.0 in

## 2024-07-28 DIAGNOSIS — R6889 Other general symptoms and signs: Secondary | ICD-10-CM

## 2024-07-28 DIAGNOSIS — Z20828 Contact with and (suspected) exposure to other viral communicable diseases: Secondary | ICD-10-CM | POA: Diagnosis not present

## 2024-07-28 DIAGNOSIS — J069 Acute upper respiratory infection, unspecified: Secondary | ICD-10-CM

## 2024-07-28 LAB — POC COVID19 BINAXNOW: SARS Coronavirus 2 Ag: NEGATIVE

## 2024-07-28 LAB — POCT RESPIRATORY SYNCYTIAL VIRUS: RSV Rapid Ag: NEGATIVE

## 2024-07-28 LAB — POC INFLUENZA A&B (BINAX/QUICKVUE)
Influenza A, POC: NEGATIVE
Influenza B, POC: NEGATIVE

## 2024-07-28 MED ORDER — AZITHROMYCIN 250 MG PO TABS: ORAL_TABLET | ORAL | 0 refills | Status: AC

## 2024-07-28 NOTE — Progress Notes (Unsigned)
 "  Acute Office Visit  Subjective:    Patient ID: Tina Oliver, female    DOB: 05-Jun-1986, 39 y.o.   MRN: [id number]  Chief Complaint  Patient presents with   Cough    After RSV exposure         Past Medical History:  Diagnosis Date   Cerebral palsy (HCC)    Muscular dystrophy (HCC)    Scoliosis    Total blindness    both eyes    Past Surgical History:  Procedure Laterality Date   EYE SURGERY Bilateral 2006   TYMPANOSTOMY TUBE PLACEMENT Bilateral 1993    Family History  Adopted: Yes    Social History   Socioeconomic History   Marital status: Single    Spouse name: Not on file   Number of children: Not on file   Years of education: Not on file   Highest education level: Not on file  Occupational History   Not on file  Tobacco Use   Smoking status: Never   Smokeless tobacco: Never  Vaping Use   Vaping status: Never Used  Substance and Sexual Activity   Alcohol use: Never   Drug use: Never   Sexual activity: Never  Other Topics Concern   Not on file  Social History Narrative   Not on file   Social Drivers of Health   Tobacco Use: Low Risk (07/28/2024)   Patient History    Smoking Tobacco Use: Never    Smokeless Tobacco Use: Never    Passive Exposure: Not on file  Financial Resource Strain: Low Risk (12/23/2023)   Overall Financial Resource Strain (CARDIA)    Difficulty of Paying Living Expenses: Not hard at all  Food Insecurity: No Food Insecurity (12/23/2023)   Epic    Worried About Programme Researcher, Broadcasting/film/video in the Last Year: Never true    Ran Out of Food in the Last Year: Never true  Transportation Needs: No Transportation Needs (12/23/2023)   Epic    Lack of Transportation (Medical): No    Lack of Transportation (Non-Medical): No  Physical Activity: Inactive (10/29/2022)   Exercise Vital Sign    Days of Exercise per Week: 0 days    Minutes of Exercise per Session: 0 min  Stress: No Stress Concern Present  (12/23/2023)   [employer] of Occupational Health - Occupational Stress Questionnaire    Feeling of Stress: Not at all  Social Connections: Moderately Isolated (12/23/2023)   Social Connection and Isolation Panel    Frequency of Communication with Friends and Family: More than three times a week    Frequency of Social Gatherings with Friends and Family: More than three times a week    Attends Religious Services: More than 4 times per year    Active Member of [employer] or Organizations: No    Attends Banker Meetings: Never    Marital Status: Never married  Intimate Partner Violence: Not At Risk (12/23/2023)   Epic    Fear of Current or Ex-Partner: No    Emotionally Abused: No    Physically Abused: No    Sexually Abused: No  Depression (PHQ2-9): Low Risk (12/23/2023)   Depression (PHQ2-9)    PHQ-2 Score: 0  Alcohol Screen: Low Risk (12/23/2023)   Alcohol Screen    Last Alcohol Screening Score (AUDIT): 0  Housing: Low Risk (12/23/2023)   Epic    Unable to Pay for Housing in the Last Year: No    Number of Times Moved in  the Last Year: 0    Homeless in the Last Year: No  Utilities: Not At Risk (12/23/2023)   Epic    Threatened with loss of utilities: No  Health Literacy: Inadequate Health Literacy (05/06/2023)   B1300 Health Literacy    Frequency of need for help with medical instructions: Always    Outpatient Medications Prior to Visit  Medication Sig Dispense Refill   rosuvastatin  (CRESTOR ) 40 MG tablet TAKE 1 TABLET(40 MG) BY MOUTH DAILY 90 tablet 0   XULANE 150-35 MCG/24HR transdermal patch UNWRAP AND APPLY 1 PATCH TO BUTTOCK OR ABDOMEN ONCE WEEKLY 3 patch 5   No facility-administered medications prior to visit.    Allergies[1]  Review of Systems  Constitutional:  Positive for fatigue. Negative for chills and fever.  HENT:  Positive for congestion.   Respiratory:  Positive for cough, chest tightness and wheezing.        Objective:         07/28/2024   10:50 AM 07/10/2024    9:43 AM 04/04/2024    9:22 AM  Vitals with BMI  Height 4' 11 4' 11 4' 11  Weight  132 lbs 3 oz 136 lbs  BMI  26.69 27.45  Systolic 120 124 889  Diastolic 72 74 80  Pulse 65 63 65    No data found.   Physical Exam  Health Maintenance Due  Topic Date Due   Hepatitis B Vaccines 19-59 Average Risk (1 of 3 - 19+ 3-dose series) Never done   Cervical Cancer Screening (HPV/Pap Cotest)  Never done       Topic Date Due   Hepatitis B Vaccines 19-59 Average Risk (1 of 3 - 19+ 3-dose series) Never done     Lab Results  Component Value Date   TSH 2.150 07/10/2024   Lab Results  Component Value Date   WBC 7.7 05/06/2023   HGB 14.1 05/06/2023   HCT 45.2 05/06/2023   MCV 87 05/06/2023   PLT 259 05/06/2023   Lab Results  Component Value Date   NA 141 07/10/2024   K 4.8 07/10/2024   CO2 23 07/10/2024   GLUCOSE 81 07/10/2024   BUN 11 07/10/2024   CREATININE 0.57 07/10/2024   BILITOT 0.3 07/10/2024   ALKPHOS 102 07/10/2024   AST 22 07/10/2024   ALT 18 07/10/2024   PROT 7.4 07/10/2024   ALBUMIN 4.0 07/10/2024   CALCIUM  10.0 07/10/2024   ANIONGAP 9 11/30/2018   EGFR 120 07/10/2024   Lab Results  Component Value Date   CHOL 257 (H) 05/06/2023   Lab Results  Component Value Date   HDL 79 05/06/2023   Lab Results  Component Value Date   LDLCALC 158 (H) 05/06/2023   Lab Results  Component Value Date   TRIG 118 05/06/2023   Lab Results  Component Value Date   CHOLHDL 3.3 05/06/2023   Lab Results  Component Value Date   HGBA1C 5.5 07/10/2024        Results for orders placed or performed in visit on 07/10/24  POCT glycosylated hemoglobin (Hb A1C)   Collection Time: 07/10/24 10:10 AM  Result Value Ref Range   Hemoglobin A1C     HbA1c POC (<> result, manual entry) 5.5 4.0 - 5.6 %   HbA1c, POC (prediabetic range)     HbA1c, POC (controlled diabetic range)    POCT Lipid Panel   Collection Time: 07/10/24 10:10  AM  Result Value Ref Range   TC 205  HDL 56    TRG 98    LDL 130    Non-HDL 149    TC/HDL    Comprehensive metabolic panel with GFR   Collection Time: 07/10/24 11:13 AM  Result Value Ref Range   Glucose 81 70 - 99 mg/dL   BUN 11 6 - 20 mg/dL   Creatinine, Ser 9.42 0.57 - 1.00 mg/dL   eGFR 879 >40 fO/fpw/8.26   BUN/Creatinine Ratio 19 9 - 23   Sodium 141 134 - 144 mmol/L   Potassium 4.8 3.5 - 5.2 mmol/L   Chloride 104 96 - 106 mmol/L   CO2 23 20 - 29 mmol/L   Calcium  10.0 8.7 - 10.2 mg/dL   Total Protein 7.4 6.0 - 8.5 g/dL   Albumin 4.0 3.9 - 4.9 g/dL   Globulin, Total 3.4 1.5 - 4.5 g/dL   Bilirubin Total 0.3 0.0 - 1.2 mg/dL   Alkaline Phosphatase 102 41 - 116 IU/L   AST 22 0 - 40 IU/L   ALT 18 0 - 32 IU/L  T4, free   Collection Time: 07/10/24 11:13 AM  Result Value Ref Range   Free T4 0.93 0.82 - 1.77 ng/dL  TSH   Collection Time: 07/10/24 11:13 AM  Result Value Ref Range   TSH 2.150 0.450 - 4.500 uIU/mL     Assessment & Plan:   Assessment & Plan     Body mass index is 26.7 kg/m.SABRA  Assessment and Plan      No orders of the defined types were placed in this encounter.   No orders of the defined types were placed in this encounter.    Follow-up: No follow-ups on file.  An After Visit Summary was printed and given to the patient.  Harrie CHRISTELLA Cedar, FNP [facility] [phone]       [1] Allergies Allergen Reactions   Ceclor [Cefaclor] Nausea And Vomiting    Tolerates ceftriaxone  fine   Amoxicillin Rash    Mild rash as a young child. Unknown cephalosporin history/not tried  "

## 2024-07-28 NOTE — Telephone Encounter (Signed)
 FYI Only or Action Required?: FYI only for provider: appointment scheduled on 07/28/2024 at 10:40am with Harrie Cedar FNP .  Patient was last seen in primary care on 07/10/2024 by Sherre Clapper, MD.  Called Nurse Triage reporting Cough.  Symptoms began over a week ago after being exposed to RSV two weeks ago.  Interventions attempted: OTC medications: Children's Dimetapp and Rest, hydration, or home remedies.  Symptoms are: gradually worsening.  Triage Disposition: See Physician Within 24 Hours  Patient/caregiver understands and will follow disposition?: Yes       Message from [facility] sent at 07/28/2024  8:28 AM EST  Reason for Triage: hurts all over.. congestion/coughing.. worsening.. exposed in the last 2 weeks - RSV.SABRA   Reason for Disposition  [1] Continuous (nonstop) coughing interferes with work or school AND [2] no improvement using cough treatment per Care Advice  Answer Assessment - Initial Assessment Questions Patient's mother calling and advising that the patient was exposed in the past two weeks to RSV and is having cold-like symptoms   Patient has been taking Children's Dimetapp  Mother denied patient having any known fevers, difficulty breathing, chest pain that she is aware of.  Patient has a history of muscular dystrophy Coughing but unable to get any mucous to come up---started as a dry cough but now sounds like chest congestion but cannot cough up the mucous No history of blood clots that the mother is aware of  Appointment made with provider Harrie Cedar FNP at PCP office today 07/28/2024 at 10:40am and mother was advised of mask precautions.  Patient's mother is advised to call us  back if anything changes or with any further questions/concerns. She is advised that if anything worsens to go to the Emergency Room. Patient's mother verbalized understanding.  Protocols used: Cough - Acute Non-Productive-A-AH

## 2024-07-29 NOTE — Assessment & Plan Note (Signed)
 RSV - negative Orders:   POCT respiratory syncytial virus

## 2024-07-29 NOTE — Assessment & Plan Note (Signed)
 Flu A & B - negative Covid - negative Orders:   POC Influenza A&B (Binax test)   POC COVID-19

## 2024-07-29 NOTE — Assessment & Plan Note (Signed)
 Acute upper respiratory infection Negative for RSV, flu, and COVID. Concern for pneumonia due to symptom duration and exposure. - Prescribed azithromycin : two tablets on day one, then one tablet daily for four days. - Continue OTC cough medicine as needed.  Orders:   azithromycin  (ZITHROMAX ) 250 MG tablet; Take 2 tablets on day 1, then 1 tablet daily on days 2 through 5

## 2024-08-03 ENCOUNTER — Telehealth: Payer: MEDICAID | Admitting: Family Medicine

## 2024-08-03 ENCOUNTER — Ambulatory Visit: Payer: Self-pay

## 2024-08-03 DIAGNOSIS — R0602 Shortness of breath: Secondary | ICD-10-CM | POA: Diagnosis not present

## 2024-08-03 DIAGNOSIS — Z20828 Contact with and (suspected) exposure to other viral communicable diseases: Secondary | ICD-10-CM | POA: Diagnosis not present

## 2024-08-03 NOTE — Assessment & Plan Note (Addendum)
 Suspected pneumonia Worsening symptoms despite azithromycin . Differential includes pneumonia. - Ordered chest x-ray stat. - If confirmed, prescribe antibiotics based on recommended guidelines. Orders:   DG Chest 2 View; Future

## 2024-08-03 NOTE — Telephone Encounter (Signed)
 FYI Only or Action Required?: FYI only for provider: appointment scheduled on 08/03/24.  Patient was last seen in primary care on 07/28/2024 by Teressa Harrie HERO, FNP.  Called Nurse Triage reporting Coughcoughing spells loses breath.  Symptoms began 2 weeks ago .  Interventions attempted: Prescription medications: Z pack.  Symptoms are: stable.  Triage Disposition: See Physician Within 24 Hours  Patient/caregiver understands and will follow disposition?: yes       Message from Kendralyn S sent at 08/03/2024  9:26 AM EST  Reason for Triage: cough and symptoms have gotten worse since being seen on the 16th and finishing her z pac. Also feeling nauseated   Reason for Disposition  SEVERE coughing spells (e.g., whooping sound after coughing, vomiting after coughing)    No vomiting  Answer Assessment - Initial Assessment Questions Per mother pt unable to come in or it would be very hard, please call if needs to be in person. Advised pt's mother that they need to see in office for this issue. She said she would bring pt in if someone fro the office told her to.     1. ONSET: When did the cough begin?      2 weeks  2. SEVERITY: How bad is the cough today?      Cough spells loses her breath 3. SPUTUM: Describe the color of your sputum (e.g., none, dry cough; clear, white, yellow, green)     White  4. HEMOPTYSIS: Are you coughing up any blood? If Yes, ask: How much? (e.g., flecks, streaks, tablespoons, etc.)     no 5. DIFFICULTY BREATHING: Are you having difficulty breathing? If Yes, ask: How bad is it? (e.g., mild, moderate, severe)      Normal  6. FEVER: Do you have a fever? If Yes, ask: What is your temperature, how was it measured, and when did it start?     no 7. CARDIAC HISTORY: Do you have any history of heart disease? (e.g., heart attack, congestive heart failure)      no 8. LUNG HISTORY: Do you have any history of lung disease?  (e.g., pulmonary  embolus, asthma, emphysema)     none 9. PE RISK FACTORS: Do you have a history of blood clots? (or: recent major surgery, recent prolonged travel, bedridden)     None  10. OTHER SYMPTOMS: Do you have any other symptoms? (e.g., runny nose, wheezing, chest pain)       ? Wheezing, runny nose, sore chest  Protocols used: Cough - Acute Productive-A-AH

## 2024-08-03 NOTE — Progress Notes (Signed)
 "   Virtual Visit via Video Note   This visit type was conducted per patient request This format is felt to be appropriate for this patient at this time.  All issues noted in this document were discussed and addressed.  A limited physical exam was performed with this format.  A verbal consent was obtained for the virtual visit.   Date:  08/03/2024   ID:  Tina Oliver, DOB 1986/02/01, MRN [id number]  Patient Location: Home Provider Location: Office/Clinic  PCP:  Sherre Clapper, MD   No chief complaint on file.    History of Present Illness:    Discussed the use of AI scribe software for clinical note transcription with the patient, who gave verbal consent to proceed.  History of Present Illness   Tina Oliver is a 39 year old female who presents with worsening cough and respiratory symptoms after exposure to RSV.  Cough and respiratory symptoms - Worsening cough since prior visit on July 28, 2024 - Persistent cough despite completion of azithromycin  (Z-Pak) - Wheezing present - Shortness of breath noted - Chest pain attributed to coughing - No fever at this time - Recent exposure to RSV  - Unable to expectorate phlegm due to MD - Previous provider ordered suction machine that helps with expectoration of mucous.     Past Medical History:  Diagnosis Date   Cerebral palsy (HCC)    Muscular dystrophy (HCC)    Scoliosis    Total blindness    both eyes    Past Surgical History:  Procedure Laterality Date   EYE SURGERY Bilateral 2006   TYMPANOSTOMY TUBE PLACEMENT Bilateral 1993    Family History  Adopted: Yes    Social History   Socioeconomic History   Marital status: Single    Spouse name: Not on file   Number of children: Not on file   Years of education: Not on file   Highest education level: 12th grade  Occupational History   Not on file  Tobacco Use   Smoking status: Never   Smokeless tobacco: Never  Vaping Use   Vaping status: Never Used   Substance and Sexual Activity   Alcohol use: Never   Drug use: Never   Sexual activity: Never  Other Topics Concern   Not on file  Social History Narrative   Not on file   Social Drivers of Health   Tobacco Use: Low Risk (07/28/2024)   Patient History    Smoking Tobacco Use: Never    Smokeless Tobacco Use: Never    Passive Exposure: Not on file  Financial Resource Strain: Patient Declined (08/03/2024)   Overall Financial Resource Strain (CARDIA)    Difficulty of Paying Living Expenses: Patient declined  Food Insecurity: Patient Declined (08/03/2024)   Epic    Worried About Programme Researcher, Broadcasting/film/video in the Last Year: Patient declined    Barista in the Last Year: Patient declined  Transportation Needs: Patient Declined (08/03/2024)   Epic    Lack of Transportation (Medical): Patient declined    Lack of Transportation (Non-Medical): Patient declined  Physical Activity: Inactive (08/03/2024)   Exercise Vital Sign    Days of Exercise per Week: 0 days    Minutes of Exercise per Session: Not on file  Stress: Stress Concern Present (08/03/2024)   [employer] of Occupational Health - Occupational Stress Questionnaire    Feeling of Stress: To some extent  Social Connections: Moderately Integrated (08/03/2024)   Social Connection and  Isolation Panel    Frequency of Communication with Friends and Family: Twice a week    Frequency of Social Gatherings with Friends and Family: Three times a week    Attends Religious Services: More than 4 times per year    Active Member of Clubs or Organizations: Yes    Attends Banker Meetings: More than 4 times per year    Marital Status: Never married  Intimate Partner Violence: Not At Risk (12/23/2023)   Epic    Fear of Current or Ex-Partner: No    Emotionally Abused: No    Physically Abused: No    Sexually Abused: No  Depression (PHQ2-9): Low Risk (12/23/2023)   Depression (PHQ2-9)    PHQ-2 Score: 0  Alcohol Screen: Low Risk  (12/23/2023)   Alcohol Screen    Last Alcohol Screening Score (AUDIT): 0  Housing: Unknown (08/03/2024)   Epic    Unable to Pay for Housing in the Last Year: Patient declined    Number of Times Moved in the Last Year: 0    Homeless in the Last Year: No  Utilities: Not At Risk (12/23/2023)   Epic    Threatened with loss of utilities: No  Health Literacy: Inadequate Health Literacy (05/06/2023)   B1300 Health Literacy    Frequency of need for help with medical instructions: Always    Outpatient Medications Prior to Visit  Medication Sig Dispense Refill   rosuvastatin  (CRESTOR ) 40 MG tablet TAKE 1 TABLET(40 MG) BY MOUTH DAILY 90 tablet 0   XULANE 150-35 MCG/24HR transdermal patch UNWRAP AND APPLY 1 PATCH TO BUTTOCK OR ABDOMEN ONCE WEEKLY 3 patch 5   No facility-administered medications prior to visit.    Allergies[1]   Social History[2]   Review of Systems  Constitutional:  Negative for chills, fever and malaise/fatigue.  HENT:  Negative for congestion, ear pain, sinus pain and sore throat.   Eyes: Negative.   Respiratory:  Positive for cough, shortness of breath and wheezing. Negative for sputum production.   Cardiovascular:  Negative for chest pain, palpitations and leg swelling.  Gastrointestinal:  Positive for nausea. Negative for constipation, diarrhea and vomiting.  Genitourinary:  Negative for dysuria, frequency and urgency.  Musculoskeletal: Negative.   Skin: Negative.   Neurological:  Negative for dizziness and headaches.  Endo/Heme/Allergies: Negative.   Psychiatric/Behavioral: Negative.       Labs/Other Tests and Data Reviewed:    Recent Labs: 07/10/2024: ALT 18; BUN 11; Creatinine, Ser 0.57; Potassium 4.8; Sodium 141; TSH 2.150   Recent Lipid Panel Lab Results  Component Value Date/Time   CHOL 257 (H) 05/06/2023 10:22 AM   TRIG 118 05/06/2023 10:22 AM   HDL 79 05/06/2023 10:22 AM   CHOLHDL 3.3 05/06/2023 10:22 AM   LDLCALC 158 (H) 05/06/2023 10:22 AM     Wt Readings from Last 3 Encounters:  07/10/24 132 lb 3.2 oz (60 kg)  04/04/24 136 lb (61.7 kg)  12/23/23 138 lb (62.6 kg)     Objective:    Vital Signs:  There were no vitals taken for this visit.   Physical Exam Constitutional:      Appearance: She is ill-appearing.  HENT:     Nose: Congestion present.  Pulmonary:     Breath sounds: Rhonchi present.    Unable to fully assess due to the nature of the visit.  ASSESSMENT & PLAN:    Assessment & Plan Shortness of breath Fitting and adjustment of respiratory appliance Requires suction machine for respiratory support. -  Coordinate with [employer] for suction machine [id number] - Fax today's note with order for suction machine Orders:   DG Chest 2 View; Future  Exposure to respiratory syncytial virus (RSV) Suspected pneumonia Worsening symptoms despite azithromycin . Differential includes pneumonia. - Ordered chest x-ray stat. - If confirmed, prescribe antibiotics based on recommended guidelines. Orders:   DG Chest 2 View; Future   Follow Up:  In Person prn  Signed, Harrie Cedar, FNP [facility] [phone]      [1]  Allergies Allergen Reactions   Ceclor [Cefaclor] Nausea And Vomiting    Tolerates ceftriaxone  fine   Amoxicillin Rash    Mild rash as a young child. Unknown cephalosporin history/not tried  [2]  Social History Tobacco Use   Smoking status: Never   Smokeless tobacco: Never  Vaping Use   Vaping status: Never Used  Substance Use Topics   Alcohol use: Never   Drug use: Never   "

## 2024-08-04 ENCOUNTER — Ambulatory Visit: Payer: Self-pay

## 2024-08-04 NOTE — Telephone Encounter (Signed)
 Patient mother Tina Oliver calling to say they are at [facility] outpatient imaging department for her chest xray but the staff have not received the order. Chart reviewed order in Epic. Asking for faxed order. [facility] is closed at this time. Reached out to leadership who advise to call [facility] on call provider to see if they can provide a verbal order.   [facility] hospital  Fax [phone] Phone [phone]   Patient mother Tina Oliver contact number [phone]   Spoke with on call provider Harrie Cedar NP she gave ok for this writer to give verbal order as in Epic or if unauthorized to give verbal order, ok to provide [facility] with on call phone number.   This clinical research associate called to [facility] imaging department and spoke to [place] who accepted verbal order for DG Chest 2 View Stat Shortness of breath [R06.02] Exposure to respiratory syncytial virus (RSV) [Z20.828]      Call Results- Best Contact Number? [phone]        Reason for Disposition  [1] Caller requests to speak ONLY to PCP AND [2] URGENT question  Protocols used: PCP Call - No Triage-A-AH

## 2024-08-07 ENCOUNTER — Other Ambulatory Visit: Payer: Self-pay | Admitting: Family Medicine

## 2024-08-07 DIAGNOSIS — J069 Acute upper respiratory infection, unspecified: Secondary | ICD-10-CM

## 2024-08-07 MED ORDER — PREDNISONE 20 MG PO TABS: ORAL_TABLET | ORAL | 0 refills | Status: AC

## 2024-10-13 ENCOUNTER — Ambulatory Visit: Payer: MEDICAID | Admitting: Family Medicine
